# Patient Record
Sex: Female | Born: 1955
Health system: Southern US, Community
[De-identification: ages and names within clinical notes are randomized; demographics above are authoritative.]

## PROBLEM LIST (undated history)

## (undated) DIAGNOSIS — I10 Essential (primary) hypertension: Secondary | ICD-10-CM

## (undated) DIAGNOSIS — E7800 Pure hypercholesterolemia, unspecified: Secondary | ICD-10-CM

## (undated) DIAGNOSIS — F419 Anxiety disorder, unspecified: Secondary | ICD-10-CM

## (undated) HISTORY — PX: TONSILLECTOMY: SUR1361

## (undated) HISTORY — PX: OTHER SURGICAL HISTORY: SHX169

## (undated) HISTORY — DX: Essential (primary) hypertension: I10

## (undated) HISTORY — DX: Anxiety disorder, unspecified: F41.9

## (undated) HISTORY — DX: Pure hypercholesterolemia, unspecified: E78.00

## (undated) HISTORY — PX: COLONOSCOPY: SHX174

---

## 2004-10-06 ENCOUNTER — Ambulatory Visit: Payer: Self-pay

## 2004-10-27 ENCOUNTER — Emergency Department: Payer: Self-pay | Admitting: Emergency Medicine

## 2004-10-27 ENCOUNTER — Other Ambulatory Visit: Payer: Self-pay

## 2004-11-05 ENCOUNTER — Emergency Department: Payer: Self-pay | Admitting: Emergency Medicine

## 2005-10-14 ENCOUNTER — Ambulatory Visit: Payer: Self-pay

## 2006-10-28 ENCOUNTER — Ambulatory Visit: Payer: Self-pay | Admitting: Gastroenterology

## 2006-11-15 ENCOUNTER — Ambulatory Visit: Payer: Self-pay | Admitting: Internal Medicine

## 2007-11-21 ENCOUNTER — Ambulatory Visit: Payer: Self-pay | Admitting: Internal Medicine

## 2009-10-02 ENCOUNTER — Ambulatory Visit: Payer: Self-pay | Admitting: Internal Medicine

## 2011-04-13 ENCOUNTER — Ambulatory Visit: Payer: Self-pay

## 2012-10-11 ENCOUNTER — Ambulatory Visit: Payer: Self-pay

## 2013-06-21 DIAGNOSIS — C8330 Diffuse large B-cell lymphoma, unspecified site: Secondary | ICD-10-CM | POA: Insufficient documentation

## 2014-05-15 ENCOUNTER — Ambulatory Visit (INDEPENDENT_AMBULATORY_CARE_PROVIDER_SITE_OTHER): Payer: BC Managed Care – PPO | Admitting: Podiatry

## 2014-05-15 ENCOUNTER — Encounter: Payer: Self-pay | Admitting: Podiatry

## 2014-05-15 ENCOUNTER — Ambulatory Visit (INDEPENDENT_AMBULATORY_CARE_PROVIDER_SITE_OTHER): Payer: BC Managed Care – PPO

## 2014-05-15 VITALS — BP 128/76 | HR 84 | Resp 16 | Ht 65.0 in | Wt 150.0 lb

## 2014-05-15 DIAGNOSIS — M79673 Pain in unspecified foot: Secondary | ICD-10-CM

## 2014-05-15 DIAGNOSIS — M779 Enthesopathy, unspecified: Secondary | ICD-10-CM

## 2014-05-15 NOTE — Progress Notes (Signed)
   Subjective:    Patient ID: Jasmine Barron, female    DOB: [birth year], 59 y.o.   MRN: [id number]  HPI Comments: Second week in December the right foot 2nd met , painful at times, sometimes it feels like walking on a rock   Foot Pain      Review of Systems  All other systems reviewed and are negative.      Objective:   Physical Exam: I have reviewed her past medical history medications allergies surgery social history and review of systems. Ulcers are strongly palpable bilateral. Neurologic sensorium is intact per Semmes-Weinstein monofilament. Deep tendon reflexes are intact bilateral and muscle strength is 5 over 5 dorsiflexion plantar flexors and inverters and everters all intrinsic musculature is intact. Orthopedic evaluation demonstrates all joints distal to the ankle for range of motion without crepitus. There she does have pain on end range of motion of the second metatarsophalangeal joint on dorsiflexion and plantarflexion. Hallux abductovalgus deformity is prominent right over left. Radiographic evaluation does demonstrate an elongated second metatarsal with an increase in the first intermetatarsal angle to the normal value of the right foot. Medial deviation of the second digit is also noted.        Assessment & Plan:  Assessment: Capsulitis second metatarsophalangeal joint right. Capsulitis with early dislocation syndrome second metatarsophalangeal joint right. Hallux abductovalgus deformity right.  Plan: Discussed appropriate shoe gear stress changes sizes ice therapy shoe modifications. Discussed the etiology pathology conservative versus surgical therapies. After sterile Betadine skin prep I injected 2 mg of dexamethasone with local anesthetic to the second metatarsophalangeal joint intra-articularly. She was also dispensed a carbon graphite insole. I will follow-up with her in 1 month. We discussed the possible need for orthotics.

## 2014-06-12 ENCOUNTER — Encounter: Payer: Self-pay | Admitting: Podiatry

## 2014-06-12 ENCOUNTER — Ambulatory Visit (INDEPENDENT_AMBULATORY_CARE_PROVIDER_SITE_OTHER): Payer: BC Managed Care – PPO | Admitting: Podiatry

## 2014-06-12 VITALS — BP 108/67 | HR 81 | Resp 16

## 2014-06-12 DIAGNOSIS — M779 Enthesopathy, unspecified: Secondary | ICD-10-CM

## 2014-06-12 NOTE — Progress Notes (Signed)
She presents today for follow-up of capsulitis sub-second metatarsophalangeal joint right foot. She states that she's been taping the toes together and seems to be helping to some degree.  Objective: Vital signs are stable she is alert and oriented 3 she has pain on end range of motion of the second metatarsophalangeal joint with thick swelling beneath the second metatarsophalangeal joint right foot.  Assessment: Chronic capsulitis second metatarsophalangeal joint of the right foot.  Plan: She was scanned for set of orthotics today. We did discuss the possible need for surgical procedure in the late spring or early summer.

## 2014-06-26 ENCOUNTER — Emergency Department
Admit: 2014-06-26 | Disposition: A | Discharge status: Home / Self Care | Payer: Self-pay | Admitting: Emergency Medicine

## 2014-07-08 ENCOUNTER — Ambulatory Visit: Payer: BC Managed Care – PPO

## 2014-07-08 ENCOUNTER — Ambulatory Visit (INDEPENDENT_AMBULATORY_CARE_PROVIDER_SITE_OTHER): Payer: BC Managed Care – PPO | Admitting: *Deleted

## 2014-07-08 DIAGNOSIS — M779 Enthesopathy, unspecified: Secondary | ICD-10-CM

## 2014-07-08 NOTE — Patient Instructions (Signed)

## 2014-07-08 NOTE — Progress Notes (Signed)
Orthotics dispensed. Given instructions for breakin. Will see in 1 mo. For eval., if needed.

## 2014-07-15 ENCOUNTER — Ambulatory Visit: Payer: BC Managed Care – PPO

## 2014-08-07 ENCOUNTER — Ambulatory Visit: Payer: BC Managed Care – PPO | Admitting: Podiatry

## 2017-03-15 ENCOUNTER — Other Ambulatory Visit: Payer: Self-pay | Admitting: Nurse Practitioner

## 2017-03-16 ENCOUNTER — Other Ambulatory Visit: Payer: Self-pay

## 2017-03-16 MED ORDER — LUBIPROSTONE 24 MCG PO CAPS: 24.0000 ug | ORAL_CAPSULE | Freq: Two times a day (BID) | ORAL | 1 refills | Status: DC

## 2017-04-15 ENCOUNTER — Other Ambulatory Visit: Payer: Self-pay

## 2017-04-15 MED ORDER — CITALOPRAM HYDROBROMIDE 40 MG PO TABS: 40.0000 mg | ORAL_TABLET | Freq: Every day | ORAL | 3 refills | Status: DC

## 2017-06-07 ENCOUNTER — Ambulatory Visit: Payer: BC Managed Care – PPO | Admitting: Nurse Practitioner

## 2017-06-07 ENCOUNTER — Encounter: Payer: Self-pay | Admitting: Nurse Practitioner

## 2017-06-07 VITALS — BP 120/80 | HR 75 | Resp 16 | Ht 65.0 in | Wt 140.0 lb

## 2017-06-07 DIAGNOSIS — I10 Essential (primary) hypertension: Secondary | ICD-10-CM

## 2017-06-07 DIAGNOSIS — E7800 Pure hypercholesterolemia, unspecified: Secondary | ICD-10-CM | POA: Diagnosis not present

## 2017-06-07 DIAGNOSIS — F419 Anxiety disorder, unspecified: Secondary | ICD-10-CM

## 2017-06-07 DIAGNOSIS — F321 Major depressive disorder, single episode, moderate: Secondary | ICD-10-CM | POA: Diagnosis not present

## 2017-06-07 NOTE — Progress Notes (Signed)
[facility] [place], [place] [address]  Internal MEDICINE  Office Visit Note  Patient Name: Jasmine Barron  [id number]  [id number]  Date of Service: 06/08/2017  Chief Complaint  Patient presents with  . Hypertension    The patient is here for routine follow up exam. She admits to increased depression and anxiety. Has several personal issues all happening at once. Feels overwhelmed at times. States that she is still having more good days than bad, overall.  She has no other physical concerns or complaints at this time.     Pt is here for routine follow up.    Current Medication: Outpatient Encounter Medications as of 06/07/2017  Medication Sig  . citalopram (CELEXA) 40 MG tablet Take 1 tablet (40 mg total) by mouth daily.  . clonazePAM (KLONOPIN) 0.5 MG tablet Take 0.5 mg by mouth 2 (two) times daily as needed for anxiety.  Marland Kitchen lisinopril (PRINIVIL,ZESTRIL) 5 MG tablet Take 5 mg by mouth daily.  Marland Kitchen lubiprostone (AMITIZA) 24 MCG capsule Take 1 capsule (24 mcg total) by mouth 2 (two) times daily with a meal.  . simvastatin (ZOCOR) 20 MG tablet Take 20 mg by mouth daily.  . [DISCONTINUED] predniSONE (DELTASONE) 10 MG tablet Take 10 mg by mouth daily with breakfast.   No facility-administered encounter medications on file as of 06/07/2017.     Surgical History: History reviewed. No pertinent surgical history.  Medical History: Past Medical History:  Diagnosis Date  . Anxiety   . High cholesterol   . Hypertension     Family History: No family history on file.  Social History   Socioeconomic History  . Marital status: Divorced    Spouse name: Not on file  . Number of children: Not on file  . Years of education: Not on file  . Highest education level: Not on file  Social Needs  . Financial resource strain: Not on file  . Food insecurity - worry: Not on file  . Food insecurity - inability: Not on file  . Transportation needs - medical: Not on  file  . Transportation needs - non-medical: Not on file  Occupational History  . Not on file  Tobacco Use  . Smoking status: Never Smoker  . Smokeless tobacco: Never Used  Substance and Sexual Activity  . Alcohol use: Yes    Alcohol/week: 0.0 oz    Frequency: Never    Comment: social  . Drug use: No  . Sexual activity: Not on file  Other Topics Concern  . Not on file  Social History Narrative  . Not on file      Review of Systems  Constitutional: Negative for activity change, chills, fatigue and unexpected weight change.  HENT: Negative for congestion, postnasal drip, rhinorrhea, sneezing and sore throat.   Eyes: Negative.  Negative for redness.  Respiratory: Negative for cough, chest tightness, shortness of breath and wheezing.   Cardiovascular: Negative for chest pain and palpitations.  Gastrointestinal: Negative for abdominal pain, constipation, diarrhea and nausea.  Endocrine: Negative for cold intolerance, heat intolerance, polydipsia, polyphagia and polyuria.  Genitourinary: Negative for dysuria and frequency.  Musculoskeletal: Negative for arthralgias, back pain, joint swelling and neck pain.  Skin: Negative for rash.  Allergic/Immunologic: Negative for environmental allergies.  Neurological: Negative for tremors, numbness and headaches.  Hematological: Negative for adenopathy. Does not bruise/bleed easily.  Psychiatric/Behavioral: Positive for behavioral problems (Depression). Negative for sleep disturbance and suicidal ideas. The patient is nervous/anxious.  Vital Signs: BP 120/80   Pulse 75   Resp 16   Ht 5\' 5"  (1.651 m)   Wt 140 lb (63.5 kg)   SpO2 97%   BMI 23.30 kg/m    Physical Exam  Constitutional: She is oriented to person, place, and time. She appears well-developed and well-nourished. No distress.  HENT:  Head: Normocephalic and atraumatic.  Mouth/Throat: Oropharynx is clear and moist. No oropharyngeal exudate.  Eyes: EOM are normal. Pupils  are equal, round, and reactive to light.  Neck: Normal range of motion. Neck supple. No JVD present. Carotid bruit is not present. No tracheal deviation present. No thyromegaly present.  Cardiovascular: Normal rate, regular rhythm and normal heart sounds. Exam reveals no gallop and no friction rub.  No murmur heard. Pulmonary/Chest: Effort normal and breath sounds normal. No respiratory distress. She has no wheezes. She has no rales. She exhibits no tenderness.  Abdominal: Soft. Bowel sounds are normal. There is no tenderness.  Musculoskeletal: Normal range of motion.  Lymphadenopathy:    She has no cervical adenopathy.  Neurological: She is alert and oriented to person, place, and time. No cranial nerve deficit.  Skin: Skin is warm and dry. She is not diaphoretic.  Psychiatric: She has a normal mood and affect. Her behavior is normal. Judgment and thought content normal.  Nursing note and vitals reviewed.  Assessment/Plan: 1. Essential hypertension Very stable. Continue bp medicaton as prescribed.   2. High cholesterol Recent cholesterol panel stable. Continue statin as prescribed.   3. Moderate major depression (HCC) Mildly worse due to increased personal stress. Will keep citalopram at 40mg  daily for now. Will adjust as indicated.   4. Anxiety May continue clonazepam 0.5mg  twice daily if needed for acute anxiety.   General Counseling: bradleigh sonnen understanding of the findings of todays visit and agrees with plan of treatment. I have discussed any further diagnostic evaluation that may be needed or ordered today. We also reviewed her medications today. she has been encouraged to call the office with any questions or concerns that should arise related to todays visit.   This patient was seen by Leretha Pol, FNP- C in Collaboration with Dr Lavera Guise as a part of collaborative care agreement   Time spent: 107 Minutes     Dr Lavera Guise Internal medicine

## 2017-06-08 ENCOUNTER — Encounter: Payer: Self-pay | Admitting: Nurse Practitioner

## 2017-06-08 DIAGNOSIS — E7800 Pure hypercholesterolemia, unspecified: Secondary | ICD-10-CM | POA: Insufficient documentation

## 2017-06-08 DIAGNOSIS — F321 Major depressive disorder, single episode, moderate: Secondary | ICD-10-CM | POA: Insufficient documentation

## 2017-06-08 DIAGNOSIS — I10 Essential (primary) hypertension: Secondary | ICD-10-CM | POA: Insufficient documentation

## 2017-06-08 DIAGNOSIS — F419 Anxiety disorder, unspecified: Secondary | ICD-10-CM | POA: Insufficient documentation

## 2017-08-04 ENCOUNTER — Telehealth: Payer: Self-pay | Admitting: Internal Medicine

## 2017-08-04 NOTE — Telephone Encounter (Signed)
Tried calling pt to advise her cologuard results are negative, pt number has been disconnected unable to reach pt. Jasmine Barron

## 2017-08-15 ENCOUNTER — Other Ambulatory Visit: Payer: Self-pay

## 2017-08-15 MED ORDER — CITALOPRAM HYDROBROMIDE 40 MG PO TABS: 40.0000 mg | ORAL_TABLET | Freq: Every day | ORAL | 5 refills | Status: DC

## 2017-09-28 NOTE — Progress Notes (Signed)
Error

## 2017-10-05 ENCOUNTER — Other Ambulatory Visit: Payer: Self-pay | Admitting: Internal Medicine

## 2017-12-15 ENCOUNTER — Ambulatory Visit: Payer: BC Managed Care – PPO | Admitting: Nurse Practitioner

## 2017-12-15 ENCOUNTER — Encounter: Payer: Self-pay | Admitting: Nurse Practitioner

## 2017-12-15 VITALS — BP 122/80 | HR 77 | Resp 16 | Ht 65.0 in | Wt 141.8 lb

## 2017-12-15 DIAGNOSIS — R300 Dysuria: Secondary | ICD-10-CM | POA: Insufficient documentation

## 2017-12-15 DIAGNOSIS — Z1239 Encounter for other screening for malignant neoplasm of breast: Secondary | ICD-10-CM | POA: Insufficient documentation

## 2017-12-15 DIAGNOSIS — K5909 Other constipation: Secondary | ICD-10-CM | POA: Diagnosis not present

## 2017-12-15 DIAGNOSIS — E559 Vitamin D deficiency, unspecified: Secondary | ICD-10-CM

## 2017-12-15 DIAGNOSIS — Z0001 Encounter for general adult medical examination with abnormal findings: Secondary | ICD-10-CM

## 2017-12-15 DIAGNOSIS — Z1231 Encounter for screening mammogram for malignant neoplasm of breast: Secondary | ICD-10-CM

## 2017-12-15 DIAGNOSIS — E782 Mixed hyperlipidemia: Secondary | ICD-10-CM | POA: Diagnosis not present

## 2017-12-15 DIAGNOSIS — I10 Essential (primary) hypertension: Secondary | ICD-10-CM | POA: Diagnosis not present

## 2017-12-15 DIAGNOSIS — F419 Anxiety disorder, unspecified: Secondary | ICD-10-CM

## 2017-12-15 DIAGNOSIS — F321 Major depressive disorder, single episode, moderate: Secondary | ICD-10-CM

## 2017-12-15 MED ORDER — LUBIPROSTONE 24 MCG PO CAPS: 24.0000 ug | ORAL_CAPSULE | Freq: Two times a day (BID) | ORAL | 1 refills | Status: DC

## 2017-12-15 MED ORDER — LISINOPRIL 5 MG PO TABS: 5.0000 mg | ORAL_TABLET | Freq: Every day | ORAL | 5 refills | Status: DC

## 2017-12-15 MED ORDER — SIMVASTATIN 20 MG PO TABS: 20.0000 mg | ORAL_TABLET | Freq: Every day | ORAL | 5 refills | Status: DC

## 2017-12-15 MED ORDER — CLONAZEPAM 0.5 MG PO TABS: 0.5000 mg | ORAL_TABLET | Freq: Two times a day (BID) | ORAL | 3 refills | Status: DC | PRN

## 2017-12-15 MED ORDER — CITALOPRAM HYDROBROMIDE 40 MG PO TABS: 40.0000 mg | ORAL_TABLET | Freq: Every day | ORAL | 5 refills | Status: DC

## 2017-12-15 NOTE — Progress Notes (Signed)
[facility] [place], [place] [address]  Internal MEDICINE  Office Visit Note  Patient Name: Jasmine Barron  [id number]  [id number]  Date of Service: 12/15/2017   Pt is here for routine health maintenance examination  Chief Complaint  Patient presents with  . Annual Exam    6 month physical  . Hypertension     Hypertension  This is a chronic problem. The current episode started more than 1 year ago. The problem is unchanged. The problem is controlled. Associated symptoms include anxiety. Pertinent negatives include no chest pain, headaches, neck pain, palpitations or shortness of breath. There are no associated agents to hypertension. Risk factors for coronary artery disease include dyslipidemia, post-menopausal state and stress. Past treatments include ACE inhibitors. The current treatment provides moderate improvement. There are no compliance problems.      Current Medication: Outpatient Encounter Medications as of 12/15/2017  Medication Sig  . citalopram (CELEXA) 40 MG tablet Take 1 tablet (40 mg total) by mouth daily.  . clonazePAM (KLONOPIN) 0.5 MG tablet Take 1 tablet (0.5 mg total) by mouth 2 (two) times daily as needed for anxiety.  Marland Kitchen lisinopril (PRINIVIL,ZESTRIL) 5 MG tablet Take 1 tablet (5 mg total) by mouth daily.  Marland Kitchen lubiprostone (AMITIZA) 24 MCG capsule Take 1 capsule (24 mcg total) by mouth 2 (two) times daily with a meal.  . simvastatin (ZOCOR) 20 MG tablet Take 1 tablet (20 mg total) by mouth at bedtime.  . [DISCONTINUED] citalopram (CELEXA) 40 MG tablet Take 1 tablet (40 mg total) by mouth daily.  . [DISCONTINUED] clonazePAM (KLONOPIN) 0.5 MG tablet Take 0.5 mg by mouth 2 (two) times daily as needed for anxiety.  . [DISCONTINUED] lisinopril (PRINIVIL,ZESTRIL) 5 MG tablet Take 5 mg by mouth daily.  . [DISCONTINUED] lubiprostone (AMITIZA) 24 MCG capsule Take 1 capsule (24 mcg total) by mouth 2 (two) times daily with a meal.  . [DISCONTINUED]  simvastatin (ZOCOR) 20 MG tablet TAKE 1 TABLET BY MOUTH AT BEDTIME   No facility-administered encounter medications on file as of 12/15/2017.     Surgical History: History reviewed. No pertinent surgical history.  Medical History: Past Medical History:  Diagnosis Date  . Anxiety   . High cholesterol   . Hypertension     Family History: Family History  Problem Relation Age of Onset  . Diabetes Mother   . Heart disease Mother   . Hypertension Mother   . Stroke Father   . Diabetes Brother   . Diabetes Maternal Grandmother       Review of Systems  Constitutional: Negative for activity change, chills, fatigue and unexpected weight change.  HENT: Negative for congestion, postnasal drip, rhinorrhea, sneezing and sore throat.   Eyes: Negative.  Negative for redness.  Respiratory: Negative for cough, chest tightness, shortness of breath and wheezing.   Cardiovascular: Negative for chest pain and palpitations.  Gastrointestinal: Positive for constipation. Negative for abdominal pain, diarrhea and nausea.       Chronic   Endocrine: Negative for cold intolerance, heat intolerance, polydipsia, polyphagia and polyuria.  Genitourinary: Negative.  Negative for dysuria and frequency.  Musculoskeletal: Negative for arthralgias, back pain, joint swelling and neck pain.  Skin: Negative for rash.  Allergic/Immunologic: Negative for environmental allergies.  Neurological: Negative for dizziness, tremors, numbness and headaches.  Hematological: Negative for adenopathy. Does not bruise/bleed easily.  Psychiatric/Behavioral: Positive for behavioral problems (Depression). Negative for sleep disturbance and suicidal ideas. The patient is nervous/anxious.  Today's Vitals   12/15/17 1416  BP: 122/80  Pulse: 77  Resp: 16  SpO2: 98%  Weight: 141 lb 12.8 oz (64.3 kg)  Height: 5\' 5"  (1.651 m)    Physical Exam  Constitutional: She is oriented to person, place, and time. She appears  well-developed and well-nourished. No distress.  HENT:  Head: Normocephalic and atraumatic.  Nose: Nose normal.  Mouth/Throat: Oropharynx is clear and moist. No oropharyngeal exudate.  Eyes: Pupils are equal, round, and reactive to light. Conjunctivae and EOM are normal.  Neck: Normal range of motion. Neck supple. No JVD present. Carotid bruit is not present. No tracheal deviation present. No thyromegaly present.  Cardiovascular: Normal rate, regular rhythm, normal heart sounds and intact distal pulses. Exam reveals no gallop and no friction rub.  No murmur heard. Pulmonary/Chest: Effort normal and breath sounds normal. No respiratory distress. She has no wheezes. She has no rales. She exhibits no tenderness.  Abdominal: Soft. Bowel sounds are normal. There is no tenderness.  Musculoskeletal: Normal range of motion.  Lymphadenopathy:    She has no cervical adenopathy.  Neurological: She is alert and oriented to person, place, and time. No cranial nerve deficit.  Skin: Skin is warm and dry. Capillary refill takes less than 2 seconds. She is not diaphoretic.  Psychiatric: She has a normal mood and affect. Her behavior is normal. Judgment and thought content normal.  Nursing note and vitals reviewed.  Assessment/Plan: 1. Encounter for general adult medical examination with abnormal findings Annual health maintenance exam today - CBC with Differential/Platelet - Comprehensive metabolic panel - T4, free - TSH - Lipid panel  2. Essential hypertension Stable. Continue bp medication as prescribed.  - CBC with Differential/Platelet - Comprehensive metabolic panel - lisinopril (PRINIVIL,ZESTRIL) 5 MG tablet; Take 1 tablet (5 mg total) by mouth daily.  Dispense: 30 tablet; Refill: 5  3. Mixed hyperlipidemia Check fasting lipid panel and adjust simvastatin as indicated.  - simvastatin (ZOCOR) 20 MG tablet; Take 1 tablet (20 mg total) by mouth at bedtime.  Dispense: 30 tablet; Refill: 5  4.  Chronic constipation - lubiprostone (AMITIZA) 24 MCG capsule; Take 1 capsule (24 mcg total) by mouth 2 (two) times daily with a meal.  Dispense: 60 capsule; Refill: 1  5. Moderate major depression (HCC) - citalopram (CELEXA) 40 MG tablet; Take 1 tablet (40 mg total) by mouth daily.  Dispense: 30 tablet; Refill: 5  6. Anxiety May continue clonazepam 0.5mg  twice daily if needed for acute anxiety. New prescription provided today  - clonazePAM (KLONOPIN) 0.5 MG tablet; Take 1 tablet (0.5 mg total) by mouth 2 (two) times daily as needed for anxiety.  Dispense: 60 tablet; Refill: 3  7. Vitamin D deficiency - Vitamin D 1,25 dihydroxy  8. Screening for breast cancer - MM DIGITAL SCREENING BILATERAL; Future  9. Dysuria - UA/M w/rflx Culture, Routine  General Counseling: Jasmine Barron verbalizes understanding of the findings of todays visit and agrees with plan of treatment. I have discussed any further diagnostic evaluation that may be needed or ordered today. We also reviewed her medications today. she has been encouraged to call the office with any questions or concerns that should arise related to todays visit.    Counseling:  Hypertension Counseling:   The following hypertensive lifestyle modification were recommended and discussed:  1. Limiting alcohol intake to less than 1 oz/day of ethanol:(24 oz of beer or 8 oz of wine or 2 oz of 100-proof whiskey). 2. Take baby ASA 81 mg daily.  3. Importance of regular aerobic exercise and losing weight. 4. Reduce dietary saturated fat and cholesterol intake for overall cardiovascular health. 5. Maintaining adequate dietary potassium, calcium, and magnesium intake. 6. Regular monitoring of the blood pressure. 7. Reduce sodium intake to less than 100 mmol/day (less than 2.3 gm of sodium or less than 6 gm of sodium choride)   This patient was seen by [place] with Dr Lavera Guise as a part of collaborative care agreement  Orders Placed  This Encounter  Procedures  . MM DIGITAL SCREENING BILATERAL  . UA/M w/rflx Culture, Routine  . CBC with Differential/Platelet  . Comprehensive metabolic panel  . T4, free  . TSH  . Lipid panel  . Vitamin D 1,25 dihydroxy    Meds ordered this encounter  Medications  . citalopram (CELEXA) 40 MG tablet    Sig: Take 1 tablet (40 mg total) by mouth daily.    Dispense:  30 tablet    Refill:  5    Order Specific Question:   Supervising Provider    Answer:   Lavera Guise [[id number]]  . clonazePAM (KLONOPIN) 0.5 MG tablet    Sig: Take 1 tablet (0.5 mg total) by mouth 2 (two) times daily as needed for anxiety.    Dispense:  60 tablet    Refill:  3    Order Specific Question:   Supervising Provider    Answer:   Lavera Guise [[id number]]  . lisinopril (PRINIVIL,ZESTRIL) 5 MG tablet    Sig: Take 1 tablet (5 mg total) by mouth daily.    Dispense:  30 tablet    Refill:  5    Order Specific Question:   Supervising Provider    Answer:   Lavera Guise [[id number]]  . lubiprostone (AMITIZA) 24 MCG capsule    Sig: Take 1 capsule (24 mcg total) by mouth 2 (two) times daily with a meal.    Dispense:  60 capsule    Refill:  1    Order Specific Question:   Supervising Provider    Answer:   Lavera Guise [place]  . simvastatin (ZOCOR) 20 MG tablet    Sig: Take 1 tablet (20 mg total) by mouth at bedtime.    Dispense:  30 tablet    Refill:  5    Please consider 90 day supplies to promote better adherence    Order Specific Question:   Supervising Provider    Answer:   Lavera Guise [[id number]]    Time spent: [place], MD  Internal Medicine

## 2017-12-16 LAB — UA/M W/RFLX CULTURE, ROUTINE
Bilirubin, UA: NEGATIVE
Glucose, UA: NEGATIVE
KETONES UA: NEGATIVE
Leukocytes, UA: NEGATIVE
Nitrite, UA: NEGATIVE
PH UA: 6.5 (ref 5.0–7.5)
Protein, UA: NEGATIVE
RBC, UA: NEGATIVE
Specific Gravity, UA: 1.013 (ref 1.005–1.030)
UUROB: 0.2 mg/dL (ref 0.2–1.0)

## 2017-12-16 LAB — MICROSCOPIC EXAMINATION
Bacteria, UA: NONE SEEN
CASTS: NONE SEEN /LPF
EPITHELIAL CELLS (NON RENAL): NONE SEEN /HPF (ref 0–10)
RBC MICROSCOPIC, UA: NONE SEEN /HPF (ref 0–2)

## 2018-01-03 ENCOUNTER — Encounter: Payer: Self-pay | Admitting: Internal Medicine

## 2018-01-03 LAB — COLOGUARD

## 2018-01-03 NOTE — Progress Notes (Signed)
SCANNED IN COLOGUARD RESULTS REPORTED ON 08/02/17.

## 2018-06-15 ENCOUNTER — Ambulatory Visit: Payer: BC Managed Care – PPO | Admitting: Nurse Practitioner

## 2018-06-15 ENCOUNTER — Other Ambulatory Visit: Payer: Self-pay | Admitting: Nurse Practitioner

## 2018-06-15 ENCOUNTER — Other Ambulatory Visit: Payer: Self-pay

## 2018-06-15 ENCOUNTER — Encounter: Payer: Self-pay | Admitting: Nurse Practitioner

## 2018-06-15 VITALS — BP 139/72 | HR 64 | Resp 16 | Ht 65.0 in | Wt 137.6 lb

## 2018-06-15 DIAGNOSIS — F419 Anxiety disorder, unspecified: Secondary | ICD-10-CM

## 2018-06-15 DIAGNOSIS — I10 Essential (primary) hypertension: Secondary | ICD-10-CM | POA: Diagnosis not present

## 2018-06-15 DIAGNOSIS — K5909 Other constipation: Secondary | ICD-10-CM

## 2018-06-15 MED ORDER — CLONAZEPAM 0.5 MG PO TABS: 0.5000 mg | ORAL_TABLET | Freq: Two times a day (BID) | ORAL | 3 refills | Status: DC | PRN

## 2018-06-15 MED ORDER — LINACLOTIDE 145 MCG PO CAPS: 145.0000 ug | ORAL_CAPSULE | Freq: Every day | ORAL | 5 refills | Status: DC

## 2018-06-15 NOTE — Progress Notes (Signed)
[facility] [place], [place] [address]  Internal MEDICINE  Office Visit Note  Patient Name: Jasmine Barron  [id number]  [id number]  Date of Service: 06/29/2018  Chief Complaint  Patient presents with  . Medical Management of Chronic Issues    6 month follow up  . Hypertension  . Hyperlipidemia    The patient is here for routine follow up exam. She admits to increased depression and anxiety. Has several personal issues all happening at once. Feels overwhelmed at times. States that she is still having more good days than bad, overall.  She has no other physical concerns or complaints at this time.  Hypertension  This is a chronic problem. The current episode started more than 1 year ago. The problem is controlled. Associated symptoms include anxiety and malaise/fatigue. Pertinent negatives include no chest pain, headaches, neck pain, palpitations or shortness of breath. Risk factors for coronary artery disease include dyslipidemia and stress. Past treatments include ACE inhibitors. The current treatment provides moderate improvement. There are no compliance problems.        Current Medication: Outpatient Encounter Medications as of 06/15/2018  Medication Sig  . citalopram (CELEXA) 40 MG tablet Take 1 tablet (40 mg total) by mouth daily.  . clonazePAM (KLONOPIN) 0.5 MG tablet Take 1 tablet (0.5 mg total) by mouth 2 (two) times daily as needed for anxiety.  Marland Kitchen lisinopril (PRINIVIL,ZESTRIL) 5 MG tablet Take 1 tablet (5 mg total) by mouth daily.  . simvastatin (ZOCOR) 20 MG tablet Take 1 tablet (20 mg total) by mouth at bedtime.  . [DISCONTINUED] clonazePAM (KLONOPIN) 0.5 MG tablet Take 1 tablet (0.5 mg total) by mouth 2 (two) times daily as needed for anxiety.  . [DISCONTINUED] lubiprostone (AMITIZA) 24 MCG capsule Take 1 capsule (24 mcg total) by mouth 2 (two) times daily with a meal.  . linaclotide (LINZESS) 145 MCG CAPS capsule Take 1 capsule (145 mcg total) by  mouth daily.   No facility-administered encounter medications on file as of 06/15/2018.     Surgical History: History reviewed. No pertinent surgical history.  Medical History: Past Medical History:  Diagnosis Date  . Anxiety   . High cholesterol   . Hypertension     Family History: Family History  Problem Relation Age of Onset  . Diabetes Mother   . Heart disease Mother   . Hypertension Mother   . Stroke Father   . Diabetes Brother   . Diabetes Maternal Grandmother     Social History   Socioeconomic History  . Marital status: Divorced    Spouse name: Not on file  . Number of children: Not on file  . Years of education: Not on file  . Highest education level: Not on file  Occupational History  . Not on file  Social Needs  . Financial resource strain: Not on file  . Food insecurity:    Worry: Not on file    Inability: Not on file  . Transportation needs:    Medical: Not on file    Non-medical: Not on file  Tobacco Use  . Smoking status: Never Smoker  . Smokeless tobacco: Never Used  Substance and Sexual Activity  . Alcohol use: Yes    Alcohol/week: 0.0 standard drinks    Frequency: Never    Comment: social  . Drug use: No  . Sexual activity: Not on file  Lifestyle  . Physical activity:    Days per week: Not on file    Minutes  per session: Not on file  . Stress: Not on file  Relationships  . Social connections:    Talks on phone: Not on file    Gets together: Not on file    Attends religious service: Not on file    Active member of club or organization: Not on file    Attends meetings of clubs or organizations: Not on file    Relationship status: Not on file  . Intimate partner violence:    Fear of current or ex partner: Not on file    Emotionally abused: Not on file    Physically abused: Not on file    Forced sexual activity: Not on file  Other Topics Concern  . Not on file  Social History Narrative  . Not on file      Review of Systems   Constitutional: Positive for malaise/fatigue. Negative for activity change, chills, fatigue and unexpected weight change.  HENT: Negative for congestion, postnasal drip, rhinorrhea, sneezing and sore throat.   Eyes: Negative.  Negative for redness.  Respiratory: Negative for cough, chest tightness, shortness of breath and wheezing.   Cardiovascular: Negative for chest pain and palpitations.  Gastrointestinal: Positive for constipation. Negative for abdominal pain, diarrhea and nausea.       Chronic   Endocrine: Negative for cold intolerance, heat intolerance, polydipsia and polyuria.  Musculoskeletal: Negative for arthralgias, back pain, joint swelling and neck pain.  Skin: Negative for rash.  Allergic/Immunologic: Negative for environmental allergies.  Neurological: Negative for dizziness, tremors, numbness and headaches.  Hematological: Negative for adenopathy. Does not bruise/bleed easily.  Psychiatric/Behavioral: Positive for dysphoric mood. Negative for behavioral problems (Depression), sleep disturbance and suicidal ideas. The patient is nervous/anxious.     Today's Vitals   06/15/18 1535  BP: 139/72  Pulse: 64  Resp: 16  SpO2: 99%  Weight: 137 lb 9.6 oz (62.4 kg)  Height: 5\' 5"  (1.651 m)   Body mass index is 22.9 kg/m.  Physical Exam Vitals signs and nursing note reviewed.  Constitutional:      General: She is not in acute distress.    Appearance: Normal appearance. She is well-developed. She is not diaphoretic.  HENT:     Head: Normocephalic and atraumatic.     Nose: Nose normal.     Mouth/Throat:     Pharynx: No oropharyngeal exudate.  Eyes:     Conjunctiva/sclera: Conjunctivae normal.     Pupils: Pupils are equal, round, and reactive to light.  Neck:     Musculoskeletal: Normal range of motion and neck supple.     Thyroid: No thyromegaly.     Vascular: No carotid bruit or JVD.     Trachea: No tracheal deviation.  Cardiovascular:     Rate and Rhythm: Normal  rate and regular rhythm.     Heart sounds: Normal heart sounds. No murmur. No friction rub. No gallop.   Pulmonary:     Effort: Pulmonary effort is normal. No respiratory distress.     Breath sounds: Normal breath sounds. No wheezing or rales.  Chest:     Chest wall: No tenderness.  Abdominal:     General: Bowel sounds are normal.     Palpations: Abdomen is soft.     Tenderness: There is no abdominal tenderness.  Musculoskeletal: Normal range of motion.  Lymphadenopathy:     Cervical: No cervical adenopathy.  Skin:    General: Skin is warm and dry.     Capillary Refill: Capillary refill takes less than 2  seconds.  Neurological:     Mental Status: She is alert and oriented to person, place, and time.     Cranial Nerves: No cranial nerve deficit.  Psychiatric:        Behavior: Behavior normal.        Thought Content: Thought content normal.        Judgment: Judgment normal.    Assessment/Plan: 1. Essential hypertension Stable. Continue bp medication as prescribed   2. Chronic constipation amitiza not really working for constipation. Change to linzess 156mcg daily. Increased fiber in the diet and drink plenty of water.  - linaclotide (LINZESS) 145 MCG CAPS capsule; Take 1 capsule (145 mcg total) by mouth daily.  Dispense: 30 capsule; Refill: 5  3. Anxiety May continue to take clonazepam 0.5mg  twice daily as needed for acute anxiety. Refills providedtodya.  - clonazePAM (KLONOPIN) 0.5 MG tablet; Take 1 tablet (0.5 mg total) by mouth 2 (two) times daily as needed for anxiety.  Dispense: 60 tablet; Refill: 3  General Counseling: Courtland verbalizes understanding of the findings of todays visit and agrees with plan of treatment. I have discussed any further diagnostic evaluation that may be needed or ordered today. We also reviewed her medications today. she has been encouraged to call the office with any questions or concerns that should arise related to todays visit.  Hypertension  Counseling:   The following hypertensive lifestyle modification were recommended and discussed:  1. Limiting alcohol intake to less than 1 oz/day of ethanol:(24 oz of beer or 8 oz of wine or 2 oz of 100-proof whiskey). 2. Take baby ASA 81 mg daily. 3. Importance of regular aerobic exercise and losing weight. 4. Reduce dietary saturated fat and cholesterol intake for overall cardiovascular health. 5. Maintaining adequate dietary potassium, calcium, and magnesium intake. 6. Regular monitoring of the blood pressure. 7. Reduce sodium intake to less than 100 mmol/day (less than 2.3 gm of sodium or less than 6 gm of sodium choride)   This patient was seen by [place] with Dr Lavera Guise as a part of collaborative care agreement  Meds ordered this encounter  Medications  . linaclotide (LINZESS) 145 MCG CAPS capsule    Sig: Take 1 capsule (145 mcg total) by mouth daily.    Dispense:  30 capsule    Refill:  5    Has been taking amitiza and this is no longer working for her.    Order Specific Question:   Supervising Provider    Answer:   Lavera Guise [[id number]]  . clonazePAM (KLONOPIN) 0.5 MG tablet    Sig: Take 1 tablet (0.5 mg total) by mouth 2 (two) times daily as needed for anxiety.    Dispense:  60 tablet    Refill:  3    Order Specific Question:   Supervising Provider    Answer:   Lavera Guise [[id number]]    Time spent: 21 Minutes      Dr Lavera Guise Internal medicine

## 2018-06-16 LAB — CBC
HEMATOCRIT: 42.2 % (ref 34.0–46.6)
HEMOGLOBIN: 14.3 g/dL (ref 11.1–15.9)
MCH: 30.3 pg (ref 26.6–33.0)
MCHC: 33.9 g/dL (ref 31.5–35.7)
MCV: 89 fL (ref 79–97)
Platelets: 270 10*3/uL (ref 150–450)
RBC: 4.72 x10E6/uL (ref 3.77–5.28)
RDW: 12.7 % (ref 11.7–15.4)
WBC: 3.7 10*3/uL (ref 3.4–10.8)

## 2018-06-16 LAB — LIPID PANEL W/O CHOL/HDL RATIO
CHOLESTEROL TOTAL: 203 mg/dL — AB (ref 100–199)
HDL: 62 mg/dL (ref >39)
LDL Calculated: 126 mg/dL — ABNORMAL HIGH (ref 0–99)
Triglycerides: 73 mg/dL (ref 0–149)
VLDL Cholesterol Cal: 15 mg/dL (ref 5–40)

## 2018-06-16 LAB — COMPREHENSIVE METABOLIC PANEL
A/G RATIO: 2.2 (ref 1.2–2.2)
ALK PHOS: 68 IU/L (ref 39–117)
ALT: 21 IU/L (ref 0–32)
AST: 22 IU/L (ref 0–40)
Albumin: 5.1 g/dL — ABNORMAL HIGH (ref 3.8–4.8)
BILIRUBIN TOTAL: 0.3 mg/dL (ref 0.0–1.2)
BUN / CREAT RATIO: 48 — AB (ref 12–28)
BUN: 22 mg/dL (ref 8–27)
CHLORIDE: 103 mmol/L (ref 96–106)
CO2: 22 mmol/L (ref 20–29)
Calcium: 9.9 mg/dL (ref 8.7–10.3)
Creatinine, Ser: 0.46 mg/dL — ABNORMAL LOW (ref 0.57–1.00)
GFR calc non Af Amer: 105 mL/min/{1.73_m2} (ref >59)
GFR, EST AFRICAN AMERICAN: 122 mL/min/{1.73_m2} (ref >59)
GLUCOSE: 88 mg/dL (ref 65–99)
Globulin, Total: 2.3 g/dL (ref 1.5–4.5)
POTASSIUM: 4.4 mmol/L (ref 3.5–5.2)
Sodium: 140 mmol/L (ref 134–144)
Total Protein: 7.4 g/dL (ref 6.0–8.5)

## 2018-06-16 LAB — TSH: TSH: 4.39 u[IU]/mL (ref 0.450–4.500)

## 2018-06-16 LAB — T4, FREE: Free T4: 1.15 ng/dL (ref 0.82–1.77)

## 2018-06-16 LAB — T3: T3, Total: 110 ng/dL (ref 71–180)

## 2018-06-16 LAB — VITAMIN D 25 HYDROXY (VIT D DEFICIENCY, FRACTURES): Vit D, 25-Hydroxy: 38.5 ng/mL (ref 30.0–100.0)

## 2018-09-18 ENCOUNTER — Other Ambulatory Visit: Payer: Self-pay

## 2018-09-18 DIAGNOSIS — I10 Essential (primary) hypertension: Secondary | ICD-10-CM

## 2018-09-18 DIAGNOSIS — F321 Major depressive disorder, single episode, moderate: Secondary | ICD-10-CM

## 2018-09-18 MED ORDER — LISINOPRIL 5 MG PO TABS: 5.0000 mg | ORAL_TABLET | Freq: Every day | ORAL | 5 refills | Status: DC

## 2018-09-18 MED ORDER — CITALOPRAM HYDROBROMIDE 40 MG PO TABS: 40.0000 mg | ORAL_TABLET | Freq: Every day | ORAL | 5 refills | Status: DC

## 2018-12-19 ENCOUNTER — Ambulatory Visit (INDEPENDENT_AMBULATORY_CARE_PROVIDER_SITE_OTHER): Payer: BC Managed Care – PPO | Admitting: Nurse Practitioner

## 2018-12-19 ENCOUNTER — Encounter: Payer: Self-pay | Admitting: Nurse Practitioner

## 2018-12-19 ENCOUNTER — Other Ambulatory Visit: Payer: Self-pay

## 2018-12-19 VITALS — BP 119/60 | HR 83 | Temp 97.3°F | Resp 16 | Ht 65.0 in | Wt 147.0 lb

## 2018-12-19 DIAGNOSIS — F419 Anxiety disorder, unspecified: Secondary | ICD-10-CM

## 2018-12-19 DIAGNOSIS — Z124 Encounter for screening for malignant neoplasm of cervix: Secondary | ICD-10-CM | POA: Diagnosis not present

## 2018-12-19 DIAGNOSIS — K5909 Other constipation: Secondary | ICD-10-CM | POA: Diagnosis not present

## 2018-12-19 DIAGNOSIS — R300 Dysuria: Secondary | ICD-10-CM

## 2018-12-19 DIAGNOSIS — I10 Essential (primary) hypertension: Secondary | ICD-10-CM | POA: Diagnosis not present

## 2018-12-19 DIAGNOSIS — Z0001 Encounter for general adult medical examination with abnormal findings: Secondary | ICD-10-CM

## 2018-12-19 DIAGNOSIS — Z13820 Encounter for screening for osteoporosis: Secondary | ICD-10-CM

## 2018-12-19 DIAGNOSIS — Z1231 Encounter for screening mammogram for malignant neoplasm of breast: Secondary | ICD-10-CM

## 2018-12-19 MED ORDER — LINACLOTIDE 290 MCG PO CAPS: 290.0000 ug | ORAL_CAPSULE | Freq: Every day | ORAL | 5 refills | Status: DC

## 2018-12-19 MED ORDER — CLONAZEPAM 0.5 MG PO TABS: 0.5000 mg | ORAL_TABLET | Freq: Two times a day (BID) | ORAL | 3 refills | Status: DC | PRN

## 2018-12-19 NOTE — Progress Notes (Signed)
[facility] [place], [place] [address]  Internal MEDICINE  Office Visit Note  Patient Name: Jasmine Barron  [id number]  [id number]  Date of Service: 01/03/2019    Pt is here for routine health maintenance examination   Chief Complaint  Patient presents with  . Annual Exam  . Gynecologic Exam  . Hypertension  . Hyperlipidemia  . Anxiety  . Quality Metric Gaps    colonoscopy, mammogram, dexa, flu vacc, pna vacc     The patient is here for health maintenance exam and pap smear.  She continues to have increased depression and anxiety. Has several personal issues all happening at once. Also remote teaching fifth grade adding stress as well. Feels overwhelmed at times. States that she is still having more good days than bad, overall.  She has no other physical concerns or complaints at this time.  She is due to get screening mammogram and bone density tests.     Current Medication: Outpatient Encounter Medications as of 12/19/2018  Medication Sig  . citalopram (CELEXA) 40 MG tablet Take 1 tablet (40 mg total) by mouth daily.  . clonazePAM (KLONOPIN) 0.5 MG tablet Take 1 tablet (0.5 mg total) by mouth 2 (two) times daily as needed for anxiety.  Marland Kitchen linaclotide (LINZESS) 290 MCG CAPS capsule Take 1 capsule (290 mcg total) by mouth daily.  Marland Kitchen lisinopril (ZESTRIL) 5 MG tablet Take 1 tablet (5 mg total) by mouth daily.  . [DISCONTINUED] clonazePAM (KLONOPIN) 0.5 MG tablet Take 1 tablet (0.5 mg total) by mouth 2 (two) times daily as needed for anxiety.  . [DISCONTINUED] linaclotide (LINZESS) 145 MCG CAPS capsule Take 1 capsule (145 mcg total) by mouth daily.  . [DISCONTINUED] simvastatin (ZOCOR) 20 MG tablet Take 1 tablet (20 mg total) by mouth at bedtime.   No facility-administered encounter medications on file as of 12/19/2018.     Surgical History: History reviewed. No pertinent surgical history.  Medical History: Past Medical History:  Diagnosis Date  .  Anxiety   . High cholesterol   . Hypertension     Family History: Family History  Problem Relation Age of Onset  . Diabetes Mother   . Heart disease Mother   . Hypertension Mother   . Stroke Father   . Diabetes Brother   . Diabetes Maternal Grandmother       Review of Systems  Constitutional: Negative for activity change, chills, fatigue and unexpected weight change.  HENT: Negative for congestion, postnasal drip, rhinorrhea, sneezing and sore throat.   Respiratory: Negative for cough, chest tightness, shortness of breath and wheezing.   Cardiovascular: Negative for chest pain and palpitations.  Gastrointestinal: Positive for constipation. Negative for abdominal pain, diarrhea and nausea.       Chronic   Endocrine: Negative for cold intolerance, heat intolerance, polydipsia and polyuria.  Genitourinary: Negative for dysuria, frequency and urgency.  Musculoskeletal: Negative for arthralgias, back pain, joint swelling and neck pain.  Skin: Negative for rash.  Allergic/Immunologic: Negative for environmental allergies.  Neurological: Negative for dizziness, tremors, numbness and headaches.  Hematological: Negative for adenopathy. Does not bruise/bleed easily.  Psychiatric/Behavioral: Positive for dysphoric mood. Negative for behavioral problems (Depression), sleep disturbance and suicidal ideas. The patient is nervous/anxious.      Today's Vitals   12/19/18 1408  BP: 119/60  Pulse: 83  Resp: 16  Temp: (!) 97.3 F (36.3 C)  SpO2: 100%  Weight: 147 lb (66.7 kg)  Height: 5\' 5"  (1.651 m)  Body mass index is 24.46 kg/m.  Physical Exam Vitals signs and nursing note reviewed.  Constitutional:      General: She is not in acute distress.    Appearance: Normal appearance. She is well-developed. She is not diaphoretic.  HENT:     Head: Normocephalic and atraumatic.     Nose: Nose normal.     Mouth/Throat:     Pharynx: No oropharyngeal exudate.  Eyes:      Conjunctiva/sclera: Conjunctivae normal.     Pupils: Pupils are equal, round, and reactive to light.  Neck:     Musculoskeletal: Normal range of motion and neck supple.     Thyroid: No thyromegaly.     Vascular: No carotid bruit or JVD.     Trachea: No tracheal deviation.  Cardiovascular:     Rate and Rhythm: Normal rate and regular rhythm.     Pulses: Normal pulses.     Heart sounds: Normal heart sounds. No murmur. No friction rub. No gallop.   Pulmonary:     Effort: Pulmonary effort is normal. No respiratory distress.     Breath sounds: Normal breath sounds. No wheezing or rales.  Chest:     Chest wall: No tenderness.     Breasts:        Right: Normal. No swelling, bleeding, inverted nipple, mass, nipple discharge, skin change or tenderness.        Left: Normal. No swelling, bleeding, inverted nipple, mass, nipple discharge, skin change or tenderness.  Abdominal:     General: Bowel sounds are normal.     Palpations: Abdomen is soft.     Tenderness: There is no abdominal tenderness.     Hernia: There is no hernia in the left inguinal area or right inguinal area.  Genitourinary:    General: Normal vulva.     Exam position: Supine.     Labia:        Right: No tenderness.        Left: No tenderness.      Vagina: Normal. No vaginal discharge, erythema or tenderness.     Cervix: No discharge, friability or erythema.     Uterus: Normal.      Adnexa: Right adnexa normal and left adnexa normal.     Comments: No tenderness, masses, or organomeglay present during bimanual exam . Musculoskeletal: Normal range of motion.  Lymphadenopathy:     Cervical: No cervical adenopathy.     Upper Body:     Right upper body: No supraclavicular or axillary adenopathy.     Left upper body: No supraclavicular or axillary adenopathy.     Lower Body: No right inguinal adenopathy. No left inguinal adenopathy.  Skin:    General: Skin is warm and dry.     Capillary Refill: Capillary refill takes less  than 2 seconds.  Neurological:     Mental Status: She is alert and oriented to person, place, and time.     Cranial Nerves: No cranial nerve deficit.  Psychiatric:        Behavior: Behavior normal.        Thought Content: Thought content normal.        Judgment: Judgment normal.      LABS: Recent Results (from the past 2160 hour(s))  Pap IG and HPV (high risk) DNA detection     Status: None   Collection Time: 12/19/18  2:00 PM  Result Value Ref Range   Interpretation NILM,QC     Comment: NEGATIVE FOR INTRAEPITHELIAL LESION  OR MALIGNANCY. THIS SPECIMEN WAS RESCREENED AS PART OF OUR QUALITY CONTROL PROGRAM.    Category NIL     Comment: Negative for Intraepithelial Lesion   Adequacy ENDO     Comment: Satisfactory for evaluation. Endocervical and/or squamous metaplastic cells (endocervical component) are present.    Clinician Provided ICD10 Comment     Comment: Z12.4   Performed by: Comment     Comment: Windell Norfolk, Cytotechnologist (ASCP)   QC reviewed by: Comment     Comment: Lamar Benes, Cytotechnologist (ASCP)   Note: Comment     Comment: The Pap smear is a screening test designed to aid in the detection of premalignant and malignant conditions of the uterine cervix.  It is not a diagnostic procedure and should not be used as the sole means of detecting cervical cancer.  Both false-positive and false-negative reports do occur.    Test Methodology Comment     Comment: This liquid based ThinPrep(R) pap test was screened with the use of an image guided system.    HPV, high-risk Negative Negative    Comment: This nucleic acid amplification high-risk HPV test detects thirteen high-risk types (16,18,31,33,35,39,45,51,52,56,58,59,68) without differentiation.   UA/M w/rflx Culture, Routine     Status: None   Collection Time: 12/19/18  2:00 PM   Specimen: Urine   URINE  Result Value Ref Range   Specific Gravity, UA CANCELED     Comment: Quantity was not sufficient for  analysis.  Result canceled by the ancillary.    pH, UA CANCELED     Comment: Test not performed  Result canceled by the ancillary.    Protein,UA CANCELED     Comment: Test not performed  Result canceled by the ancillary.    Glucose, UA CANCELED     Comment: Test not performed  Result canceled by the ancillary.    Ketones, UA CANCELED     Comment: Test not performed  Result canceled by the ancillary.     Assessment/Plan: 1. Encounter for general adult medical examination with abnormal findings Annual health maintenance exam with pap smear today.   2. Chronic constipation Continue linzess 260mcg daily. Continue high fiber diet and increase water intake.  - linaclotide (LINZESS) 290 MCG CAPS capsule; Take 1 capsule (290 mcg total) by mouth daily.  Dispense: 30 capsule; Refill: 5  3. Essential hypertension Stable. Continue bp medication as prescribed   4. Anxiety Stable. Continue citalopram 40mg  daily. May take clonazepam 0.5mg  twice daily if needed for acute anxiety.  - clonazePAM (KLONOPIN) 0.5 MG tablet; Take 1 tablet (0.5 mg total) by mouth 2 (two) times daily as needed for anxiety.  Dispense: 60 tablet; Refill: 3  5. Routine cervical smear - Pap IG and HPV (high risk) DNA detection  6. Screening for breast cancer - MM DIGITAL SCREENING BILATERAL; Future  7. Screening for osteoporosis - DG Bone Density; Future  8. Dysuria - UA/M w/rflx Culture, Routine  General Counseling: Jasmine Barron verbalizes understanding of the findings of todays visit and agrees with plan of treatment. I have discussed any further diagnostic evaluation that may be needed or ordered today. We also reviewed her medications today. she has been encouraged to call the office with any questions or concerns that should arise related to todays visit.    Counseling:  This patient was seen by Leretha Pol FNP Collaboration with Dr Lavera Guise as a part of collaborative care agreement  Orders Placed  This Encounter  Procedures  . DG Bone Density  .  MM DIGITAL SCREENING BILATERAL  . UA/M w/rflx Culture, Routine    Meds ordered this encounter  Medications  . clonazePAM (KLONOPIN) 0.5 MG tablet    Sig: Take 1 tablet (0.5 mg total) by mouth 2 (two) times daily as needed for anxiety.    Dispense:  60 tablet    Refill:  3    Order Specific Question:   Supervising Provider    Answer:   Lavera Guise [id number]  . linaclotide (LINZESS) 290 MCG CAPS capsule    Sig: Take 1 capsule (290 mcg total) by mouth daily.    Dispense:  30 capsule    Refill:  5    Please increase dose to 229mcg daily.    Order Specific Question:   Supervising Provider    Answer:   Lavera Guise [id number]    Time spent: [place], MD  Internal Medicine

## 2018-12-22 LAB — UA/M W/RFLX CULTURE, ROUTINE

## 2018-12-25 ENCOUNTER — Other Ambulatory Visit: Payer: Self-pay

## 2018-12-25 DIAGNOSIS — E782 Mixed hyperlipidemia: Secondary | ICD-10-CM

## 2018-12-25 MED ORDER — SIMVASTATIN 20 MG PO TABS: 20.0000 mg | ORAL_TABLET | Freq: Every day | ORAL | 5 refills | Status: DC

## 2018-12-30 LAB — PAP IG AND HPV HIGH-RISK: HPV, high-risk: NEGATIVE

## 2019-01-03 DIAGNOSIS — Z124 Encounter for screening for malignant neoplasm of cervix: Secondary | ICD-10-CM | POA: Insufficient documentation

## 2019-01-03 DIAGNOSIS — Z13820 Encounter for screening for osteoporosis: Secondary | ICD-10-CM | POA: Insufficient documentation

## 2019-01-03 DIAGNOSIS — Z0001 Encounter for general adult medical examination with abnormal findings: Secondary | ICD-10-CM | POA: Insufficient documentation

## 2019-04-09 ENCOUNTER — Other Ambulatory Visit: Payer: Self-pay

## 2019-04-09 DIAGNOSIS — F321 Major depressive disorder, single episode, moderate: Secondary | ICD-10-CM

## 2019-04-09 MED ORDER — CITALOPRAM HYDROBROMIDE 40 MG PO TABS: 40.0000 mg | ORAL_TABLET | Freq: Every day | ORAL | 1 refills | Status: DC

## 2019-06-14 ENCOUNTER — Telehealth: Payer: Self-pay

## 2019-06-14 NOTE — Telephone Encounter (Signed)
LMOM TO CONFIRM AND SCREEN FOR 06-18-19 OV.

## 2019-06-15 ENCOUNTER — Other Ambulatory Visit: Payer: Self-pay | Admitting: Internal Medicine

## 2019-06-15 DIAGNOSIS — F321 Major depressive disorder, single episode, moderate: Secondary | ICD-10-CM

## 2019-06-18 ENCOUNTER — Other Ambulatory Visit: Payer: Self-pay

## 2019-06-18 ENCOUNTER — Ambulatory Visit (INDEPENDENT_AMBULATORY_CARE_PROVIDER_SITE_OTHER): Payer: BC Managed Care – PPO | Admitting: Nurse Practitioner

## 2019-06-18 ENCOUNTER — Encounter: Payer: Self-pay | Admitting: Nurse Practitioner

## 2019-06-18 DIAGNOSIS — I10 Essential (primary) hypertension: Secondary | ICD-10-CM | POA: Diagnosis not present

## 2019-06-18 DIAGNOSIS — E782 Mixed hyperlipidemia: Secondary | ICD-10-CM | POA: Diagnosis not present

## 2019-06-18 DIAGNOSIS — K5909 Other constipation: Secondary | ICD-10-CM

## 2019-06-18 DIAGNOSIS — F321 Major depressive disorder, single episode, moderate: Secondary | ICD-10-CM

## 2019-06-18 DIAGNOSIS — F419 Anxiety disorder, unspecified: Secondary | ICD-10-CM

## 2019-06-18 MED ORDER — SIMVASTATIN 20 MG PO TABS: 20.0000 mg | ORAL_TABLET | Freq: Every day | ORAL | 5 refills | Status: DC

## 2019-06-18 MED ORDER — LINACLOTIDE 290 MCG PO CAPS: 290.0000 ug | ORAL_CAPSULE | Freq: Every day | ORAL | 5 refills | Status: DC

## 2019-06-18 MED ORDER — LISINOPRIL 5 MG PO TABS: 5.0000 mg | ORAL_TABLET | Freq: Every day | ORAL | 5 refills | Status: DC

## 2019-06-18 MED ORDER — CLONAZEPAM 0.5 MG PO TABS: 0.5000 mg | ORAL_TABLET | Freq: Two times a day (BID) | ORAL | 3 refills | Status: DC | PRN

## 2019-06-18 MED ORDER — CITALOPRAM HYDROBROMIDE 40 MG PO TABS: 40.0000 mg | ORAL_TABLET | Freq: Every day | ORAL | 3 refills | Status: DC

## 2019-06-18 NOTE — Progress Notes (Signed)
[facility] [place], [place] [address]  Internal MEDICINE  Office Visit Note  Patient Name: Jasmine Barron  [id number]  [id number]  Date of Service: 06/27/2019  Chief Complaint  Patient presents with  . Hypertension  . Hyperlipidemia  . Anxiety    The patient presents for follow up visit. She states that she is doing well. She has no concerns or complaints. She is due to have routine, fasting labs done. She is due to have both mammogram and bone density tests. These have been ordered but need to be scheduled. She has history of depression and generalized anxiety. These are well managed with current medications. She does need to have refills for these today.        Current Medication: Outpatient Encounter Medications as of 06/18/2019  Medication Sig  . citalopram (CELEXA) 40 MG tablet Take 1 tablet (40 mg total) by mouth daily.  . clonazePAM (KLONOPIN) 0.5 MG tablet Take 1 tablet (0.5 mg total) by mouth 2 (two) times daily as needed for anxiety.  Marland Kitchen linaclotide (LINZESS) 290 MCG CAPS capsule Take 1 capsule (290 mcg total) by mouth daily.  Marland Kitchen lisinopril (ZESTRIL) 5 MG tablet Take 1 tablet (5 mg total) by mouth daily.  . simvastatin (ZOCOR) 20 MG tablet Take 1 tablet (20 mg total) by mouth at bedtime.  . [DISCONTINUED] citalopram (CELEXA) 40 MG tablet Take 1 tablet by mouth once daily  . [DISCONTINUED] clonazePAM (KLONOPIN) 0.5 MG tablet Take 1 tablet (0.5 mg total) by mouth 2 (two) times daily as needed for anxiety.  . [DISCONTINUED] linaclotide (LINZESS) 290 MCG CAPS capsule Take 1 capsule (290 mcg total) by mouth daily.  . [DISCONTINUED] lisinopril (ZESTRIL) 5 MG tablet Take 1 tablet (5 mg total) by mouth daily.  . [DISCONTINUED] simvastatin (ZOCOR) 20 MG tablet Take 1 tablet (20 mg total) by mouth at bedtime.   No facility-administered encounter medications on file as of 06/18/2019.    Surgical History: History reviewed. No pertinent surgical  history.  Medical History: Past Medical History:  Diagnosis Date  . Anxiety   . High cholesterol   . Hypertension     Family History: Family History  Problem Relation Age of Onset  . Diabetes Mother   . Heart disease Mother   . Hypertension Mother   . Stroke Father   . Diabetes Brother   . Diabetes Maternal Grandmother     Social History   Socioeconomic History  . Marital status: Divorced    Spouse name: Not on file  . Number of children: Not on file  . Years of education: Not on file  . Highest education level: Not on file  Occupational History  . Not on file  Tobacco Use  . Smoking status: Never Smoker  . Smokeless tobacco: Never Used  Substance and Sexual Activity  . Alcohol use: Yes    Alcohol/week: 0.0 standard drinks  . Drug use: No  . Sexual activity: Not on file  Other Topics Concern  . Not on file  Social History Narrative  . Not on file   Social Determinants of Health   Financial Resource Strain:   . Difficulty of Paying Living Expenses:   Food Insecurity:   . Worried About Charity fundraiser in the Last Year:   . Arboriculturist in the Last Year:   Transportation Needs:   . Film/video editor (Medical):   Marland Kitchen Lack of Transportation (Non-Medical):   Physical Activity:   .  Days of Exercise per Week:   . Minutes of Exercise per Session:   Stress:   . Feeling of Stress :   Social Connections:   . Frequency of Communication with Friends and Family:   . Frequency of Social Gatherings with Friends and Family:   . Attends Religious Services:   . Active Member of Clubs or Organizations:   . Attends Archivist Meetings:   Marland Kitchen Marital Status:   Intimate Partner Violence:   . Fear of Current or Ex-Partner:   . Emotionally Abused:   Marland Kitchen Physically Abused:   . Sexually Abused:       Review of Systems  Constitutional: Positive for fatigue. Negative for activity change, chills and unexpected weight change.  HENT: Negative for  congestion, postnasal drip, rhinorrhea, sneezing and sore throat.   Respiratory: Negative for cough, chest tightness, shortness of breath and wheezing.   Cardiovascular: Negative for chest pain and palpitations.  Gastrointestinal: Positive for constipation. Negative for abdominal pain, diarrhea and nausea.       Chronic. Does well with linzess 290 mcg daily.    Endocrine: Negative for cold intolerance, heat intolerance, polydipsia and polyuria.  Genitourinary: Negative for urgency.  Musculoskeletal: Negative for arthralgias, back pain, joint swelling and neck pain.  Skin: Negative for rash.  Allergic/Immunologic: Negative for environmental allergies.  Neurological: Negative for dizziness, tremors, numbness and headaches.  Hematological: Negative for adenopathy. Does not bruise/bleed easily.  Psychiatric/Behavioral: Positive for dysphoric mood. Negative for behavioral problems (Depression), sleep disturbance and suicidal ideas. The patient is nervous/anxious.     Today's Vitals   06/18/19 1604  BP: 132/73  Pulse: 72  Resp: 16  Temp: (!) 97.1 F (36.2 C)  SpO2: 99%  Weight: 155 lb 9.6 oz (70.6 kg)  Height: 5\' 5"  (1.651 m)   Body mass index is 25.89 kg/m.  Physical Exam Vitals and nursing note reviewed.  Constitutional:      General: She is not in acute distress.    Appearance: Normal appearance. She is well-developed. She is not diaphoretic.  HENT:     Head: Normocephalic and atraumatic.     Nose: Nose normal.     Mouth/Throat:     Pharynx: No oropharyngeal exudate.  Eyes:     Conjunctiva/sclera: Conjunctivae normal.     Pupils: Pupils are equal, round, and reactive to light.  Neck:     Thyroid: No thyromegaly.     Vascular: No carotid bruit or JVD.     Trachea: No tracheal deviation.  Cardiovascular:     Rate and Rhythm: Normal rate and regular rhythm.     Heart sounds: Normal heart sounds. No murmur. No friction rub. No gallop.   Pulmonary:     Effort: Pulmonary  effort is normal. No respiratory distress.     Breath sounds: Normal breath sounds. No wheezing or rales.  Chest:     Chest wall: No tenderness.  Abdominal:     Palpations: Abdomen is soft.  Musculoskeletal:        General: Normal range of motion.     Cervical back: Normal range of motion and neck supple.  Lymphadenopathy:     Cervical: No cervical adenopathy.  Skin:    General: Skin is warm and dry.     Capillary Refill: Capillary refill takes less than 2 seconds.  Neurological:     Mental Status: She is alert and oriented to person, place, and time.     Cranial Nerves: No cranial nerve deficit.  Psychiatric:        Mood and Affect: Mood normal.        Behavior: Behavior normal.        Thought Content: Thought content normal.        Judgment: Judgment normal.     Assessment/Plan: 1. Essential hypertension Blood pressure stable. Continue bp medication as prescribed  - lisinopril (ZESTRIL) 5 MG tablet; Take 1 tablet (5 mg total) by mouth daily.  Dispense: 30 tablet; Refill: 5  2. Mixed hyperlipidemia Check fasting lipid panel. Adjust dosing of simvastatin as indicated.  - simvastatin (ZOCOR) 20 MG tablet; Take 1 tablet (20 mg total) by mouth at bedtime.  Dispense: 30 tablet; Refill: 5  3. Chronic constipation May continue linzess 277mcg daily.  - linaclotide (LINZESS) 290 MCG CAPS capsule; Take 1 capsule (290 mcg total) by mouth daily.  Dispense: 30 capsule; Refill: 5  4. Moderate major depression (HCC) Stable. Continue celexa 40mg  daily.  - citalopram (CELEXA) 40 MG tablet; Take 1 tablet (40 mg total) by mouth daily.  Dispense: 30 tablet; Refill: 3  5. Anxiety May take clonazepam 0.5mg  up to twice daily as needed for acute anxiety. New prescription sent to her pharmacy today.  - clonazePAM (KLONOPIN) 0.5 MG tablet; Take 1 tablet (0.5 mg total) by mouth 2 (two) times daily as needed for anxiety.  Dispense: 60 tablet; Refill: 3  General Counseling: Camill verbalizes  understanding of the findings of todays visit and agrees with plan of treatment. I have discussed any further diagnostic evaluation that may be needed or ordered today. We also reviewed her medications today. she has been encouraged to call the office with any questions or concerns that should arise related to todays visit.   This patient was seen by [place] with Dr Lavera Guise as a part of collaborative care agreement  Meds ordered this encounter  Medications  . citalopram (CELEXA) 40 MG tablet    Sig: Take 1 tablet (40 mg total) by mouth daily.    Dispense:  30 tablet    Refill:  3    Order Specific Question:   Supervising Provider    Answer:   Lavera Guise [id number]  . clonazePAM (KLONOPIN) 0.5 MG tablet    Sig: Take 1 tablet (0.5 mg total) by mouth 2 (two) times daily as needed for anxiety.    Dispense:  60 tablet    Refill:  3    Order Specific Question:   Supervising Provider    Answer:   Lavera Guise [id number]  . lisinopril (ZESTRIL) 5 MG tablet    Sig: Take 1 tablet (5 mg total) by mouth daily.    Dispense:  30 tablet    Refill:  5    Order Specific Question:   Supervising Provider    Answer:   Lavera Guise [id number]  . simvastatin (ZOCOR) 20 MG tablet    Sig: Take 1 tablet (20 mg total) by mouth at bedtime.    Dispense:  30 tablet    Refill:  5    Please consider 90 day supplies to promote better adherence    Order Specific Question:   Supervising Provider    Answer:   Lavera Guise [place]  . linaclotide (LINZESS) 290 MCG CAPS capsule    Sig: Take 1 capsule (290 mcg total) by mouth daily.    Dispense:  30 capsule    Refill:  5    Please increase dose to 237mcg  daily.    Order Specific Question:   Supervising Provider    Answer:   Lavera Guise [id number]    Total time spent: 20 Minutes   Time spent includes review of chart, medications, test results, and follow up plan with the patient.      Dr Lavera Guise Internal medicine

## 2019-10-08 ENCOUNTER — Other Ambulatory Visit: Payer: Self-pay | Admitting: Nurse Practitioner

## 2019-10-09 LAB — COMPREHENSIVE METABOLIC PANEL
ALT: 23 IU/L (ref 0–32)
AST: 23 IU/L (ref 0–40)
Albumin/Globulin Ratio: 1.7 (ref 1.2–2.2)
Albumin: 4.4 g/dL (ref 3.8–4.8)
Alkaline Phosphatase: 67 IU/L (ref 48–121)
BUN/Creatinine Ratio: 34 — ABNORMAL HIGH (ref 12–28)
BUN: 18 mg/dL (ref 8–27)
Bilirubin Total: 0.4 mg/dL (ref 0.0–1.2)
CO2: 24 mmol/L (ref 20–29)
Calcium: 9.5 mg/dL (ref 8.7–10.3)
Chloride: 106 mmol/L (ref 96–106)
Creatinine, Ser: 0.53 mg/dL — ABNORMAL LOW (ref 0.57–1.00)
GFR calc Af Amer: 114 mL/min/{1.73_m2} (ref >59)
GFR calc non Af Amer: 99 mL/min/{1.73_m2} (ref >59)
Globulin, Total: 2.6 g/dL (ref 1.5–4.5)
Glucose: 97 mg/dL (ref 65–99)
Potassium: 4 mmol/L (ref 3.5–5.2)
Sodium: 142 mmol/L (ref 134–144)
Total Protein: 7 g/dL (ref 6.0–8.5)

## 2019-10-09 LAB — CBC
Hematocrit: 39.1 % (ref 34.0–46.6)
Hemoglobin: 13.7 g/dL (ref 11.1–15.9)
MCH: 30.8 pg (ref 26.6–33.0)
MCHC: 35 g/dL (ref 31.5–35.7)
MCV: 88 fL (ref 79–97)
Platelets: 234 10*3/uL (ref 150–450)
RBC: 4.45 x10E6/uL (ref 3.77–5.28)
RDW: 12.7 % (ref 11.7–15.4)
WBC: 3.9 10*3/uL (ref 3.4–10.8)

## 2019-10-09 LAB — LIPID PANEL W/O CHOL/HDL RATIO
Cholesterol, Total: 182 mg/dL (ref 100–199)
HDL: 58 mg/dL (ref >39)
LDL Chol Calc (NIH): 101 mg/dL — ABNORMAL HIGH (ref 0–99)
Triglycerides: 130 mg/dL (ref 0–149)
VLDL Cholesterol Cal: 23 mg/dL (ref 5–40)

## 2019-10-09 LAB — VITAMIN D 25 HYDROXY (VIT D DEFICIENCY, FRACTURES): Vit D, 25-Hydroxy: 31.5 ng/mL (ref 30.0–100.0)

## 2019-10-09 LAB — TSH: TSH: 2.41 u[IU]/mL (ref 0.450–4.500)

## 2019-10-09 LAB — T4, FREE: Free T4: 0.97 ng/dL (ref 0.82–1.77)

## 2019-10-14 NOTE — Progress Notes (Signed)
Labs good. Discuss with visit 10/18/2019

## 2019-10-16 ENCOUNTER — Telehealth: Payer: Self-pay

## 2019-10-16 NOTE — Telephone Encounter (Signed)
CONFIRMED PT APPT-OFFICE-AR 

## 2019-10-18 ENCOUNTER — Other Ambulatory Visit: Payer: Self-pay

## 2019-10-18 ENCOUNTER — Ambulatory Visit (INDEPENDENT_AMBULATORY_CARE_PROVIDER_SITE_OTHER): Payer: BC Managed Care – PPO | Admitting: Nurse Practitioner

## 2019-10-18 ENCOUNTER — Encounter: Payer: Self-pay | Admitting: Nurse Practitioner

## 2019-10-18 VITALS — BP 133/88 | HR 74 | Temp 97.3°F | Resp 16 | Ht 65.0 in | Wt 152.7 lb

## 2019-10-18 DIAGNOSIS — I10 Essential (primary) hypertension: Secondary | ICD-10-CM | POA: Diagnosis not present

## 2019-10-18 DIAGNOSIS — F419 Anxiety disorder, unspecified: Secondary | ICD-10-CM

## 2019-10-18 DIAGNOSIS — E782 Mixed hyperlipidemia: Secondary | ICD-10-CM

## 2019-10-18 DIAGNOSIS — F321 Major depressive disorder, single episode, moderate: Secondary | ICD-10-CM

## 2019-10-18 MED ORDER — CLONAZEPAM 0.5 MG PO TABS: 0.5000 mg | ORAL_TABLET | Freq: Two times a day (BID) | ORAL | 3 refills | Status: DC | PRN

## 2019-10-18 MED ORDER — CITALOPRAM HYDROBROMIDE 40 MG PO TABS: 40.0000 mg | ORAL_TABLET | Freq: Every day | ORAL | 3 refills | Status: DC

## 2019-10-18 NOTE — Progress Notes (Signed)
[facility] [place], [place] [address]  Internal MEDICINE  Office Visit Note  Patient Name: Jasmine Barron  [id number]  [id number]  Date of Service: 11/14/2019  Chief Complaint  Patient presents with   Follow-up   Hyperlipidemia   Hypertension   Quality Metric Gaps    TDAP, PNA Vaccine   Leg Pain    has to push up on something to stand up    The patient presents for follow up visit. She states that she is doing well. She has no concerns or complaints. She had routine labs done prior to this visit. LDL was 101 and lipid panel was otherwise normal. All other labs were within normal limits. She is still due to have both mammogram and bone density tests. These have been ordered but need to be scheduled. States that she has putting off these tests until spread of COVID 19 has declined. She has history of depression and generalized anxiety. These are well managed with current medications. She does need to have refills for these today.       Current Medication: Outpatient Encounter Medications as of 10/18/2019  Medication Sig   citalopram (CELEXA) 40 MG tablet Take 1 tablet (40 mg total) by mouth daily.   clonazePAM (KLONOPIN) 0.5 MG tablet Take 1 tablet (0.5 mg total) by mouth 2 (two) times daily as needed for anxiety.   linaclotide (LINZESS) 290 MCG CAPS capsule Take 1 capsule (290 mcg total) by mouth daily.   lisinopril (ZESTRIL) 5 MG tablet Take 1 tablet (5 mg total) by mouth daily.   simvastatin (ZOCOR) 20 MG tablet Take 1 tablet (20 mg total) by mouth at bedtime.   [DISCONTINUED] citalopram (CELEXA) 40 MG tablet Take 1 tablet (40 mg total) by mouth daily.   [DISCONTINUED] clonazePAM (KLONOPIN) 0.5 MG tablet Take 1 tablet (0.5 mg total) by mouth 2 (two) times daily as needed for anxiety.   No facility-administered encounter medications on file as of 10/18/2019.    Surgical History: History reviewed. No pertinent surgical history.  Medical  History: Past Medical History:  Diagnosis Date   Anxiety    High cholesterol    Hypertension     Family History: Family History  Problem Relation Age of Onset   Diabetes Mother    Heart disease Mother    Hypertension Mother    Stroke Father    Diabetes Brother    Diabetes Maternal Grandmother     Social History   Socioeconomic History   Marital status: Divorced    Spouse name: Not on file   Number of children: Not on file   Years of education: Not on file   Highest education level: Not on file  Occupational History   Not on file  Tobacco Use   Smoking status: Never Smoker   Smokeless tobacco: Never Used  Substance and Sexual Activity   Alcohol use: Yes    Alcohol/week: 0.0 standard drinks   Drug use: No   Sexual activity: Not on file  Other Topics Concern   Not on file  Social History Narrative   Not on file   Social Determinants of Health   Financial Resource Strain:    Difficulty of Paying Living Expenses:   Food Insecurity:    Worried About [place] in the Last Year:    Arboriculturist in the Last Year:   Transportation Needs:    Film/video editor (Medical):    Lack of Transportation (  Non-Medical):   Physical Activity:    Days of Exercise per Week:    Minutes of Exercise per Session:   Stress:    Feeling of Stress :   Social Connections:    Frequency of Communication with Friends and Family:    Frequency of Social Gatherings with Friends and Family:    Attends Religious Services:    Active Member of Clubs or Organizations:    Attends Music therapist:    Marital Status:   Intimate Partner Violence:    Fear of Current or Ex-Partner:    Emotionally Abused:    Physically Abused:    Sexually Abused:       Review of Systems  Constitutional: Negative for activity change, chills, fatigue and unexpected weight change.  HENT: Negative for congestion, postnasal drip, rhinorrhea,  sneezing and sore throat.   Respiratory: Negative for cough, chest tightness, shortness of breath and wheezing.   Cardiovascular: Negative for chest pain and palpitations.  Gastrointestinal: Positive for constipation. Negative for abdominal pain, diarrhea and nausea.       Chronic. Does well with linzess 290 mcg daily.    Endocrine: Negative for cold intolerance, heat intolerance, polydipsia and polyuria.  Genitourinary: Negative for urgency.  Musculoskeletal: Negative for arthralgias, back pain, joint swelling and neck pain.  Skin: Negative for rash.  Allergic/Immunologic: Negative for environmental allergies.  Neurological: Negative for dizziness, tremors, numbness and headaches.  Hematological: Negative for adenopathy. Does not bruise/bleed easily.  Psychiatric/Behavioral: Positive for dysphoric mood. Negative for behavioral problems (Depression), sleep disturbance and suicidal ideas. The patient is nervous/anxious.    Today's Vitals   10/18/19 1549  BP: (!) 133/88  Pulse: 74  Resp: 16  Temp: (!) 97.3 F (36.3 C)  SpO2: 97%  Weight: 152 lb 11.2 oz (69.3 kg)  Height: 5\' 5"  (1.651 m)   Body mass index is 25.41 kg/m.  Physical Exam Vitals and nursing note reviewed.  Constitutional:      General: She is not in acute distress.    Appearance: Normal appearance. She is well-developed. She is not diaphoretic.  HENT:     Head: Normocephalic and atraumatic.     Nose: Nose normal.     Mouth/Throat:     Pharynx: No oropharyngeal exudate.  Eyes:     Conjunctiva/sclera: Conjunctivae normal.     Pupils: Pupils are equal, round, and reactive to light.  Neck:     Thyroid: No thyromegaly.     Vascular: No carotid bruit or JVD.     Trachea: No tracheal deviation.  Cardiovascular:     Rate and Rhythm: Normal rate and regular rhythm.     Heart sounds: Normal heart sounds. No murmur heard.  No friction rub. No gallop.   Pulmonary:     Effort: Pulmonary effort is normal. No respiratory  distress.     Breath sounds: Normal breath sounds. No wheezing or rales.  Chest:     Chest wall: No tenderness.  Abdominal:     Palpations: Abdomen is soft.  Musculoskeletal:        General: Normal range of motion.     Cervical back: Normal range of motion and neck supple.  Lymphadenopathy:     Cervical: No cervical adenopathy.  Skin:    General: Skin is warm and dry.     Capillary Refill: Capillary refill takes less than 2 seconds.  Neurological:     Mental Status: She is alert and oriented to person, place, and time.  Cranial Nerves: No cranial nerve deficit.  Psychiatric:        Mood and Affect: Mood normal.        Behavior: Behavior normal.        Thought Content: Thought content normal.        Judgment: Judgment normal.    Assessment/Plan: 1. Essential hypertension Stable. Continue bp medication as prescribed   2. Mixed hyperlipidemia Reviewed labs with stable cholesterol panel. Continue simvastatin as prescribed   3. Anxiety May conitnue clonazepam 0.5mg  up to twice daily as needed for acute anxiety. New prescription provided today.  - clonazePAM (KLONOPIN) 0.5 MG tablet; Take 1 tablet (0.5 mg total) by mouth 2 (two) times daily as needed for anxiety.  Dispense: 60 tablet; Refill: 3  4. Moderate major depression (HCC) Continue citalopram as prescribed  - citalopram (CELEXA) 40 MG tablet; Take 1 tablet (40 mg total) by mouth daily.  Dispense: 30 tablet; Refill: 3  General Counseling: Kataleyah verbalizes understanding of the findings of todays visit and agrees with plan of treatment. I have discussed any further diagnostic evaluation that may be needed or ordered today. We also reviewed her medications today. she has been encouraged to call the office with any questions or concerns that should arise related to todays visit.   This patient was seen by Leretha Pol FNP Collaboration with Dr Lavera Guise as a part of collaborative care agreement  Meds ordered this  encounter  Medications   clonazePAM (KLONOPIN) 0.5 MG tablet    Sig: Take 1 tablet (0.5 mg total) by mouth 2 (two) times daily as needed for anxiety.    Dispense:  60 tablet    Refill:  3    Order Specific Question:   Supervising Provider    Answer:   Lavera Guise [[id number]]   citalopram (CELEXA) 40 MG tablet    Sig: Take 1 tablet (40 mg total) by mouth daily.    Dispense:  30 tablet    Refill:  3    Order Specific Question:   Supervising Provider    Answer:   Lavera Guise [[id number]]    Total time spent: 25 Minutes   Time spent includes review of chart, medications, test results, and follow up plan with the patient.      Dr Lavera Guise Internal medicine

## 2019-11-28 ENCOUNTER — Telehealth: Payer: Self-pay

## 2019-11-28 NOTE — Telephone Encounter (Signed)
Recert for Pt signed and placed in scan.

## 2019-12-07 ENCOUNTER — Other Ambulatory Visit: Payer: Self-pay | Admitting: Nurse Practitioner

## 2019-12-07 DIAGNOSIS — Z1231 Encounter for screening mammogram for malignant neoplasm of breast: Secondary | ICD-10-CM

## 2019-12-21 ENCOUNTER — Ambulatory Visit
Admission: RE | Admit: 2019-12-21 | Discharge: 2019-12-21 | Disposition: A | Discharge status: Home / Self Care | Payer: Medicare PPO | Source: Ambulatory Visit | Attending: Nurse Practitioner | Admitting: Nurse Practitioner

## 2019-12-21 ENCOUNTER — Other Ambulatory Visit: Payer: Self-pay

## 2019-12-21 DIAGNOSIS — Z1231 Encounter for screening mammogram for malignant neoplasm of breast: Secondary | ICD-10-CM | POA: Insufficient documentation

## 2019-12-24 NOTE — Progress Notes (Signed)
Negative mammogram

## 2019-12-25 ENCOUNTER — Other Ambulatory Visit: Payer: Self-pay | Admitting: *Deleted

## 2019-12-25 ENCOUNTER — Inpatient Hospital Stay
Admission: RE | Admit: 2019-12-25 | Discharge: 2019-12-25 | Disposition: A | Discharge status: Home / Self Care | Payer: Self-pay | Source: Ambulatory Visit | Attending: *Deleted | Admitting: *Deleted

## 2019-12-25 DIAGNOSIS — Z1231 Encounter for screening mammogram for malignant neoplasm of breast: Secondary | ICD-10-CM

## 2020-01-10 ENCOUNTER — Telehealth: Payer: Self-pay

## 2020-01-10 NOTE — Telephone Encounter (Signed)
Plan of care for physical therapy signed and mailed back to [facility] at [place] [address].

## 2020-01-31 ENCOUNTER — Telehealth: Payer: Self-pay

## 2020-01-31 NOTE — Telephone Encounter (Signed)
PT recert signed by provider and faxed back to [facility] at [phone].

## 2020-02-19 ENCOUNTER — Ambulatory Visit (INDEPENDENT_AMBULATORY_CARE_PROVIDER_SITE_OTHER): Payer: Medicare PPO | Admitting: Nurse Practitioner

## 2020-02-19 ENCOUNTER — Other Ambulatory Visit: Payer: Self-pay

## 2020-02-19 VITALS — BP 127/84 | HR 85 | Temp 97.9°F | Resp 16 | Ht 65.0 in | Wt 144.6 lb

## 2020-02-19 DIAGNOSIS — E782 Mixed hyperlipidemia: Secondary | ICD-10-CM

## 2020-02-19 DIAGNOSIS — F419 Anxiety disorder, unspecified: Secondary | ICD-10-CM

## 2020-02-19 DIAGNOSIS — I10 Essential (primary) hypertension: Secondary | ICD-10-CM

## 2020-02-19 DIAGNOSIS — F321 Major depressive disorder, single episode, moderate: Secondary | ICD-10-CM

## 2020-02-19 DIAGNOSIS — Z0001 Encounter for general adult medical examination with abnormal findings: Secondary | ICD-10-CM | POA: Diagnosis not present

## 2020-02-19 NOTE — Progress Notes (Signed)
[facility] [place], [place] [address]  Internal MEDICINE  Office Visit Note  Patient Name: Jasmine Barron  [id number]  [id number]  Date of Service: 03/16/2020   Pt is here for routine health maintenance examination  Chief Complaint  Patient presents with  . Medicare Wellness  . Hyperlipidemia  . Hypertension  . Quality Metric Gaps    flu,dexa scan,Hep C  . controlled substance form    reviewed with PT     The patient is here for health maintenance exam. She states that she is doing well. She denies concerns or complaints. Blood pressure is well controlled. She had routine, fasting labs done in 09/2019 and were essentially normal. She had mammogram done 12/24/2019 which was negative. She states that her anxiety/depression is well managed on daily dose of citalopram. She does take clonazepam on intermittent basis when needed for acute anxiety.     Current Medication: Outpatient Encounter Medications as of 02/19/2020  Medication Sig  . citalopram (CELEXA) 40 MG tablet Take 1 tablet (40 mg total) by mouth daily.  . clonazePAM (KLONOPIN) 0.5 MG tablet Take 1 tablet (0.5 mg total) by mouth 2 (two) times daily as needed for anxiety.  Marland Kitchen linaclotide (LINZESS) 290 MCG CAPS capsule Take 1 capsule (290 mcg total) by mouth daily.  Marland Kitchen lisinopril (ZESTRIL) 5 MG tablet Take 1 tablet (5 mg total) by mouth daily.  . simvastatin (ZOCOR) 20 MG tablet Take 1 tablet (20 mg total) by mouth at bedtime.   No facility-administered encounter medications on file as of 02/19/2020.    Surgical History: No past surgical history on file.  Medical History: Past Medical History:  Diagnosis Date  . Anxiety   . High cholesterol   . Hypertension     Family History: Family History  Problem Relation Age of Onset  . Diabetes Mother   . Heart disease Mother   . Hypertension Mother   . Stroke Father   . Diabetes Brother   . Diabetes Maternal Grandmother       Review of  Systems  Constitutional: Negative for activity change, chills, fatigue and unexpected weight change.  HENT: Negative for congestion, postnasal drip, rhinorrhea, sneezing and sore throat.   Respiratory: Negative for cough, chest tightness, shortness of breath and wheezing.   Cardiovascular: Negative for chest pain and palpitations.  Gastrointestinal: Positive for constipation. Negative for abdominal pain, diarrhea and nausea.       Chronic. Does well with linzess 290 mcg daily.    Endocrine: Negative for cold intolerance, heat intolerance, polydipsia and polyuria.  Genitourinary: Negative for dysuria, frequency and urgency.  Musculoskeletal: Negative for arthralgias, back pain, joint swelling and neck pain.  Skin: Negative for rash.  Allergic/Immunologic: Negative for environmental allergies.  Neurological: Negative for dizziness, tremors, numbness and headaches.  Hematological: Negative for adenopathy. Does not bruise/bleed easily.  Psychiatric/Behavioral: Positive for dysphoric mood. Negative for behavioral problems (Depression), sleep disturbance and suicidal ideas. The patient is nervous/anxious.     Today's Vitals   02/19/20 1009  BP: 127/84  Pulse: 85  Resp: 16  Temp: 97.9 F (36.6 C)  SpO2: 98%  Weight: 144 lb 9.6 oz (65.6 kg)  Height: 5\' 5"  (1.651 m)   Body mass index is 24.06 kg/m.  Physical Exam Vitals and nursing note reviewed.  Constitutional:      General: She is not in acute distress.    Appearance: Normal appearance. She is well-developed. She is not diaphoretic.  HENT:  Head: Normocephalic and atraumatic.     Nose: Nose normal.     Mouth/Throat:     Pharynx: No oropharyngeal exudate.  Eyes:     Conjunctiva/sclera: Conjunctivae normal.     Pupils: Pupils are equal, round, and reactive to light.  Neck:     Thyroid: No thyromegaly.     Vascular: No carotid bruit or JVD.     Trachea: No tracheal deviation.  Cardiovascular:     Rate and Rhythm: Normal  rate and regular rhythm.     Pulses: Normal pulses.     Heart sounds: Normal heart sounds. No murmur heard. No friction rub. No gallop.   Pulmonary:     Effort: Pulmonary effort is normal. No respiratory distress.     Breath sounds: Normal breath sounds. No wheezing or rales.  Chest:     Chest wall: No tenderness.  Breasts:     Right: Normal. No swelling, bleeding, inverted nipple, mass, nipple discharge, skin change, tenderness or axillary adenopathy.     Left: Normal. No swelling, bleeding, inverted nipple, mass, nipple discharge, skin change, tenderness or axillary adenopathy.    Abdominal:     General: Bowel sounds are normal.     Palpations: Abdomen is soft.     Tenderness: There is no abdominal tenderness.  Musculoskeletal:        General: Normal range of motion.     Cervical back: Normal range of motion and neck supple.  Lymphadenopathy:     Cervical: No cervical adenopathy.     Upper Body:     Right upper body: No axillary adenopathy.     Left upper body: No axillary adenopathy.  Skin:    General: Skin is warm and dry.     Capillary Refill: Capillary refill takes less than 2 seconds.  Neurological:     General: No focal deficit present.     Mental Status: She is alert and oriented to person, place, and time.     Cranial Nerves: No cranial nerve deficit.  Psychiatric:        Mood and Affect: Mood normal.        Behavior: Behavior normal.        Thought Content: Thought content normal.        Judgment: Judgment normal.    Depression screen [facility] 2/9 02/19/2020 10/18/2019 06/18/2019 12/19/2018 06/15/2018  Decreased Interest 0 0 0 0 0  Down, Depressed, Hopeless 0 0 1 0 0  PHQ - 2 Score 0 0 1 0 0    Functional Status Survey: Is the patient deaf or have difficulty hearing?: Yes Does the patient have difficulty seeing, even when wearing glasses/contacts?: Yes Does the patient have difficulty concentrating, remembering, or making decisions?: Yes Does the patient have  difficulty walking or climbing stairs?: Yes Does the patient have difficulty dressing or bathing?: No Does the patient have difficulty doing errands alone such as visiting a doctor's office or shopping?: No  MMSE - [place] Exam 02/19/2020  Orientation to time 5  Orientation to Place 5  Registration 3  Attention/ Calculation 5  Recall 3  Language- name 2 objects 2  Language- repeat 1  Language- follow 3 step command 3  Language- read & follow direction 1  Write a sentence 1  Copy design 1  Total score 30    Fall Risk  02/19/2020 10/18/2019 06/18/2019 12/19/2018 06/15/2018  Falls in the past year? 0 1 0 0 1  Number falls in past yr: -  1 - - 0  Comment - - - - PT ACCIDENTLY KNOCKED DOWN  Injury with Fall? - 1 - - 1  Comment - hurt foot. - - ONLY BRUISING     Assessment/Plan: 1. Encounter for general adult medical examination with abnormal findings Annual health maintenance exam today.   2. Essential hypertension Stable. Continue bp medication as prescribed   3. Mixed hyperlipidemia Reviewed labs done 09/2019 which showed well managed cholesterol panel. Continue simvastatin as prescribed   4. Moderate major depression ([place]) Well managed. Continue citalopram as prescribed   5. Anxiety May continue to take clonazepam as needed and as previously prescribed.   General Counseling: meliah appleman understanding of the findings of todays visit and agrees with plan of treatment. I have discussed any further diagnostic evaluation that may be needed or ordered today. We also reviewed her medications today. she has been encouraged to call the office with any questions or concerns that should arise related to todays visit.    Counseling:   This patient was seen by Leretha Pol FNP Collaboration with Dr Lavera Guise as a part of collaborative care agreement  Total time spent: 88 Minutes  Time spent includes review of chart, medications, test results, and follow up plan with  the patient.     Lavera Guise, MD  Internal Medicine

## 2020-04-28 ENCOUNTER — Other Ambulatory Visit: Payer: Self-pay

## 2020-04-28 DIAGNOSIS — F419 Anxiety disorder, unspecified: Secondary | ICD-10-CM

## 2020-04-28 DIAGNOSIS — F321 Major depressive disorder, single episode, moderate: Secondary | ICD-10-CM

## 2020-04-28 MED ORDER — CLONAZEPAM 0.5 MG PO TABS: 0.5000 mg | ORAL_TABLET | Freq: Two times a day (BID) | ORAL | 3 refills | Status: DC | PRN

## 2020-04-28 MED ORDER — CITALOPRAM HYDROBROMIDE 40 MG PO TABS: 40.0000 mg | ORAL_TABLET | Freq: Every day | ORAL | 3 refills | Status: DC

## 2020-05-02 ENCOUNTER — Other Ambulatory Visit: Payer: Self-pay

## 2020-05-02 DIAGNOSIS — F321 Major depressive disorder, single episode, moderate: Secondary | ICD-10-CM

## 2020-05-02 MED ORDER — CITALOPRAM HYDROBROMIDE 40 MG PO TABS: 40.0000 mg | ORAL_TABLET | Freq: Every day | ORAL | 3 refills | Status: DC

## 2020-06-19 ENCOUNTER — Encounter: Payer: Self-pay | Admitting: Hospice and Palliative Medicine

## 2020-06-19 ENCOUNTER — Ambulatory Visit: Payer: Medicare PPO | Admitting: Hospice and Palliative Medicine

## 2020-06-19 VITALS — BP 126/86 | HR 75 | Temp 97.3°F | Resp 16 | Ht 65.0 in | Wt 138.8 lb

## 2020-06-19 DIAGNOSIS — F419 Anxiety disorder, unspecified: Secondary | ICD-10-CM

## 2020-06-19 DIAGNOSIS — F411 Generalized anxiety disorder: Secondary | ICD-10-CM

## 2020-06-19 DIAGNOSIS — R609 Edema, unspecified: Secondary | ICD-10-CM

## 2020-06-19 DIAGNOSIS — I10 Essential (primary) hypertension: Secondary | ICD-10-CM

## 2020-06-19 MED ORDER — CLONAZEPAM 0.5 MG PO TABS
0.5000 mg | ORAL_TABLET | Freq: Every day | ORAL | 1 refills | Status: DC
Start: 2020-06-19 — End: 2020-09-03

## 2020-06-19 MED ORDER — CLONAZEPAM 0.5 MG PO TABS: 0.5000 mg | ORAL_TABLET | Freq: Every day | ORAL | 1 refills | Status: DC

## 2020-06-19 NOTE — Progress Notes (Signed)
[facility] [place], [place] [address]  Internal MEDICINE  Office Visit Note  Patient Name: Jasmine Barron  [id number]  [id number]  Date of Service: 06/20/2020  Chief Complaint  Patient presents with  . Follow-up    Ring around right ankle sometimes it hurts, pt had pressure headache when she went to the gym and was lifting weights, refill request  . Hyperlipidemia  . Hypertension  . 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 Has joined [employer] not been in the last few weeks due to neck pain and headache after lifting heavy weights, pain and headaches have resolved at this time History of IBS-C takes Linzess and fiber pills daily that help with constipation Aware that she does not drink enough water throughout the day Drinks on average 1-2 bottles of water per day  Has noticed that after a full day of teaching and standing on her feet some swelling in bilateral ankles  Requesting refills of clonazepam--only takes as needed at bedtime to help with sleep, takes a few times per week, does not take during the day Anxiety and depression continue to be well controlled with Celexa   Current Medication: Outpatient Encounter Medications as of 06/19/2020  Medication Sig  . citalopram (CELEXA) 40 MG tablet Take 1 tablet (40 mg total) by mouth daily.  Marland Kitchen linaclotide (LINZESS) 290 MCG CAPS capsule Take 1 capsule (290 mcg total) by mouth daily.  Marland Kitchen lisinopril (ZESTRIL) 5 MG tablet Take 1 tablet (5 mg total) by mouth daily.  . simvastatin (ZOCOR) 20 MG tablet Take 1 tablet (20 mg total) by mouth at bedtime.  . [DISCONTINUED] clonazePAM (KLONOPIN) 0.5 MG tablet Take 1 tablet (0.5 mg total) by mouth 2 (two) times daily as needed for anxiety.  . clonazePAM (KLONOPIN) 0.5 MG tablet Take 1 tablet (0.5 mg total) by mouth at bedtime.  . [DISCONTINUED]  clonazePAM (KLONOPIN) 0.5 MG tablet Take 1 tablet (0.5 mg total) by mouth at bedtime.   No facility-administered encounter medications on file as of 06/19/2020.    Surgical History: History reviewed. No pertinent surgical history.  Medical History: Past Medical History:  Diagnosis Date  . Anxiety   . High cholesterol   . Hypertension     Family History: Family History  Problem Relation Age of Onset  . Diabetes Mother   . Heart disease Mother   . Hypertension Mother   . Stroke Father   . Diabetes Brother   . Diabetes Maternal Grandmother     Social History   Socioeconomic History  . Marital status: Divorced    Spouse name: Not on file  . Number of children: Not on file  . Years of education: Not on file  . Highest education level: Not on file  Occupational History  . Not on file  Tobacco Use  . Smoking status: Never Smoker  . Smokeless tobacco: Never Used  Substance and Sexual Activity  . Alcohol use: Yes    Alcohol/week: 0.0 standard drinks  . Drug use: No  . Sexual activity: Not on file  Other Topics Concern  . Not on file  Social History Narrative  . Not on file   Social Determinants of Health   Financial Resource Strain: Not on file  Food Insecurity: Not on file  Transportation Needs: Not on file  Physical Activity: Not on file  Stress: Not  on file  Social Connections: Not on file  Intimate Partner Violence: Not on file      Review of Systems  Constitutional: Negative for chills, diaphoresis and fatigue.  HENT: Negative for ear pain, postnasal drip and sinus pressure.   Eyes: Negative for photophobia, discharge, redness, itching and visual disturbance.  Respiratory: Negative for cough, shortness of breath and wheezing.   Cardiovascular: Negative for chest pain, palpitations and leg swelling.  Gastrointestinal: Negative for abdominal pain, constipation, diarrhea, nausea and vomiting.  Genitourinary: Negative for dysuria and flank pain.   Musculoskeletal: Negative for arthralgias, back pain, gait problem and neck pain.  Skin: Negative for color change.  Allergic/Immunologic: Negative for environmental allergies and food allergies.  Neurological: Negative for dizziness and headaches.  Hematological: Does not bruise/bleed easily.  Psychiatric/Behavioral: Negative for agitation, behavioral problems (depression) and hallucinations.    Vital Signs: BP 126/86   Pulse 75   Temp (!) 97.3 F (36.3 C)   Resp 16   Ht 5\' 5"  (1.651 m)   Wt 138 lb 12.8 oz (63 kg)   SpO2 98%   BMI 23.10 kg/m    Physical Exam Vitals reviewed.  Constitutional:      Appearance: Normal appearance. She is normal weight.  Cardiovascular:     Rate and Rhythm: Normal rate and regular rhythm.     Pulses: Normal pulses.     Heart sounds: Normal heart sounds.  Pulmonary:     Effort: Pulmonary effort is normal.     Breath sounds: Normal breath sounds.  Abdominal:     General: Abdomen is flat.     Palpations: Abdomen is soft.  Musculoskeletal:        General: Normal range of motion.     Cervical back: Normal range of motion.  Skin:    General: Skin is warm.  Neurological:     General: No focal deficit present.     Mental Status: She is alert and oriented to person, place, and time. Mental status is at baseline.  Psychiatric:        Mood and Affect: Mood normal.        Behavior: Behavior normal.        Thought Content: Thought content normal.        Judgment: Judgment normal.    Assessment/Plan: 1. Essential hypertension BP and HR well controlled, continue to monitor  2. Dependent edema Discussed causes of dependent edema, no evidence of edema today or evidence of DVT Encouraged to elevate legs while in seated position and importance of wearing compression stockings daily, avoid wearing stockings at night while sleeping  3. GAD (generalized anxiety disorder) Continue with Celexa and clonazepam as needed--prescription updated to bedtime  as needed Consider alternative options if use of clonazepam becomes more frequent [place] Controlled Substance Database was reviewed by me for overdose risk score (ORS) - clonazePAM (KLONOPIN) 0.5 MG tablet; Take 1 tablet (0.5 mg total) by mouth at bedtime.  Dispense: 30 tablet; Refill: 1  General Counseling: Zyia verbalizes understanding of the findings of todays visit and agrees with plan of treatment. I have discussed any further diagnostic evaluation that may be needed or ordered today. We also reviewed her medications today. she has been encouraged to call the office with any questions or concerns that should arise related to todays visit.   Meds ordered this encounter  Medications  . DISCONTD: clonazePAM (KLONOPIN) 0.5 MG tablet    Sig: Take 1 tablet (0.5 mg total) by mouth at bedtime.  Dispense:  30 tablet    Refill:  1  . clonazePAM (KLONOPIN) 0.5 MG tablet    Sig: Take 1 tablet (0.5 mg total) by mouth at bedtime.    Dispense:  30 tablet    Refill:  1    Time spent: 30 Minutes Time spent includes review of chart, medications, test results and follow-up plan with the patient.  This patient was seen by Theodoro Grist AGNP-C in Collaboration with Dr Lavera Guise as a part of collaborative care agreement     Tanna Furry. Orian Figueira AGNP-C Internal medicine

## 2020-06-20 ENCOUNTER — Encounter: Payer: Self-pay | Admitting: Hospice and Palliative Medicine

## 2020-06-21 ENCOUNTER — Other Ambulatory Visit: Payer: Self-pay | Admitting: Nurse Practitioner

## 2020-06-21 DIAGNOSIS — I10 Essential (primary) hypertension: Secondary | ICD-10-CM

## 2020-07-11 ENCOUNTER — Other Ambulatory Visit: Payer: Self-pay | Admitting: Nurse Practitioner

## 2020-07-11 DIAGNOSIS — I10 Essential (primary) hypertension: Secondary | ICD-10-CM

## 2020-07-16 ENCOUNTER — Other Ambulatory Visit: Payer: Self-pay | Admitting: Nurse Practitioner

## 2020-07-16 DIAGNOSIS — I10 Essential (primary) hypertension: Secondary | ICD-10-CM

## 2020-08-24 ENCOUNTER — Other Ambulatory Visit: Payer: Self-pay | Admitting: Nurse Practitioner

## 2020-08-24 DIAGNOSIS — E782 Mixed hyperlipidemia: Secondary | ICD-10-CM

## 2020-08-27 ENCOUNTER — Other Ambulatory Visit: Payer: Self-pay | Admitting: Nurse Practitioner

## 2020-08-27 DIAGNOSIS — E782 Mixed hyperlipidemia: Secondary | ICD-10-CM

## 2020-08-27 DIAGNOSIS — I10 Essential (primary) hypertension: Secondary | ICD-10-CM

## 2020-08-27 DIAGNOSIS — F411 Generalized anxiety disorder: Secondary | ICD-10-CM

## 2020-09-01 ENCOUNTER — Other Ambulatory Visit: Payer: Self-pay | Admitting: Nurse Practitioner

## 2020-09-01 DIAGNOSIS — E782 Mixed hyperlipidemia: Secondary | ICD-10-CM

## 2020-09-01 DIAGNOSIS — I10 Essential (primary) hypertension: Secondary | ICD-10-CM

## 2020-09-03 ENCOUNTER — Telehealth: Payer: Self-pay

## 2020-09-03 ENCOUNTER — Other Ambulatory Visit: Payer: Self-pay | Admitting: Internal Medicine

## 2020-09-03 DIAGNOSIS — F411 Generalized anxiety disorder: Secondary | ICD-10-CM

## 2020-09-03 MED ORDER — CLONAZEPAM 0.5 MG PO TABS: 0.5000 mg | ORAL_TABLET | Freq: Every day | ORAL | 1 refills | Status: DC

## 2020-09-03 NOTE — Telephone Encounter (Signed)
Pt advised we send medicine until her appt

## 2020-09-03 NOTE — Telephone Encounter (Signed)
Done

## 2020-10-20 ENCOUNTER — Other Ambulatory Visit: Payer: Self-pay

## 2020-10-20 ENCOUNTER — Ambulatory Visit (INDEPENDENT_AMBULATORY_CARE_PROVIDER_SITE_OTHER): Payer: Medicare PPO | Admitting: Nurse Practitioner

## 2020-10-20 ENCOUNTER — Encounter: Payer: Self-pay | Admitting: Nurse Practitioner

## 2020-10-20 VITALS — BP 108/86 | HR 69 | Temp 98.5°F | Resp 16 | Ht 65.0 in | Wt 144.8 lb

## 2020-10-20 DIAGNOSIS — E782 Mixed hyperlipidemia: Secondary | ICD-10-CM | POA: Diagnosis not present

## 2020-10-20 DIAGNOSIS — F321 Major depressive disorder, single episode, moderate: Secondary | ICD-10-CM | POA: Diagnosis not present

## 2020-10-20 DIAGNOSIS — I10 Essential (primary) hypertension: Secondary | ICD-10-CM

## 2020-10-20 DIAGNOSIS — Z79899 Other long term (current) drug therapy: Secondary | ICD-10-CM | POA: Diagnosis not present

## 2020-10-20 DIAGNOSIS — G4700 Insomnia, unspecified: Secondary | ICD-10-CM

## 2020-10-20 DIAGNOSIS — R609 Edema, unspecified: Secondary | ICD-10-CM | POA: Diagnosis not present

## 2020-10-20 DIAGNOSIS — F411 Generalized anxiety disorder: Secondary | ICD-10-CM

## 2020-10-20 MED ORDER — ZOSTER VAC RECOMB ADJUVANTED 50 MCG/0.5ML IM SUSR: 0.5000 mL | Freq: Once | INTRAMUSCULAR | 0 refills | Status: AC

## 2020-10-20 MED ORDER — MIRTAZAPINE 15 MG PO TABS: 15.0000 mg | ORAL_TABLET | Freq: Every day | ORAL | 0 refills | Status: DC

## 2020-10-20 NOTE — Progress Notes (Signed)
[facility] [place], [place] [address]  Internal MEDICINE  Office Visit Note  Patient Name: Jasmine Barron  [id number]  [id number]  Date of Service: 10/20/2020  Chief Complaint  Patient presents with   Follow-up   Hypertension    HPI Jasmine Barron presents for a follow up visit for hypertension, depression, anxiety, and insomnia. She is not currently taking lisinopril or simvastatin. She takes citalopram for anxiety and depression and clonazepam for insomnia.  Jasmine Barron has mixed insomnia but is having more issues with sleep maintenance at this time. Jasmine Barron has been taking clonazepam 0.5 at bedtime to help her sleep. This is the only medication she has tried for sleep. Prior documentation lists clonazepam as a medication being prescribed for anxiety but the patient states that she takes it for insomnia. It is not a recommended first-line treatment for insomnia and long-term use is concerning for addiction, tolerance and dependence. She is at max dose of citalopram ('40mg'$  daily) and has been taking this medication at bedtime. Citalopram can cause insomnia as a side effect that is dose-related. She also c/o being drowsy during the day which may be due to the extended half-life of clonazepam. She is willing to try other medications to help her sleep. Discussed with patient that there are several medication options for treating insomnia. -She has been on lisinopril 5 mg daily for hypertension for several years. Her blood pressure is 108/86 today. She stopped taking her lisinopril over a month ago. Per EHR documentation of blood pressures between today and 2019, her blood pressure has not been significantly elevated. The patient would like to remain off blood pressure medication if possible.  -Jasmine Barron stopped taking her simvastatin over 1 month ago. Her last lipid panel was drawn in July 2021 and all values were normal except for LDL of 101. Patient would like to stay off statin therapy if possible. She  will need her lipid panel rechecked, patient may stay off statin therapy until her lipid panel is drawn and evaluated.     Current Medication: Outpatient Encounter Medications as of 10/20/2020  Medication Sig   citalopram (CELEXA) 40 MG tablet Take 1 tablet (40 mg total) by mouth daily.   mirtazapine (REMERON) 15 MG tablet Take 1 tablet (15 mg total) by mouth at bedtime.   simvastatin (ZOCOR) 20 MG tablet Take 1 tablet (20 mg total) by mouth at bedtime.   [DISCONTINUED] clonazePAM (KLONOPIN) 0.5 MG tablet Take 1 tablet (0.5 mg total) by mouth at bedtime.   [DISCONTINUED] lisinopril (ZESTRIL) 5 MG tablet Take 1 tablet (5 mg total) by mouth daily.   [DISCONTINUED] Zoster Vaccine Adjuvanted [facility]) injection Inject 0.5 mLs into the muscle once.   linaclotide (LINZESS) 290 MCG CAPS capsule Take 1 capsule (290 mcg total) by mouth daily. (Patient not taking: Reported on 10/20/2020)   No facility-administered encounter medications on file as of 10/20/2020.    Surgical History: History reviewed. No pertinent surgical history.  Medical History: Past Medical History:  Diagnosis Date   Anxiety    High cholesterol    Hypertension     Family History: Family History  Problem Relation Age of Onset   Diabetes Mother    Heart disease Mother    Hypertension Mother    Stroke Father    Diabetes Brother    Diabetes Maternal Grandmother     Social History   Socioeconomic History   Marital status: Divorced    Spouse name: Not on file   Number of  children: Not on file   Years of education: Not on file   Highest education level: Not on file  Occupational History   Not on file  Tobacco Use   Smoking status: Never   Smokeless tobacco: Never  Substance and Sexual Activity   Alcohol use: Yes    Alcohol/week: 0.0 standard drinks   Drug use: No   Sexual activity: Not on file  Other Topics Concern   Not on file  Social History Narrative   Not on file   Social Determinants of Health    Financial Resource Strain: Not on file  Food Insecurity: Not on file  Transportation Needs: Not on file  Physical Activity: Not on file  Stress: Not on file  Social Connections: Not on file  Intimate Partner Violence: Not on file      Review of Systems  Constitutional:  Positive for activity change and fatigue.  HENT: Negative.    Respiratory: Negative.  Negative for cough, chest tightness, shortness of breath and wheezing.   Cardiovascular: Negative.  Negative for chest pain and palpitations.  Gastrointestinal: Negative.   Genitourinary: Negative.   Musculoskeletal: Negative.   Psychiatric/Behavioral:  Positive for behavioral problems and sleep disturbance. The patient is nervous/anxious.    Vital Signs: BP 108/86   Pulse 69   Temp 98.5 F (36.9 C)   Resp 16   Ht '5\' 5"'$  (1.651 m)   Wt 144 lb 12.8 oz (65.7 kg)   SpO2 96%   BMI 24.10 kg/m    Physical Exam Vitals reviewed.  Constitutional:      General: She is not in acute distress.    Appearance: Normal appearance. She is normal weight. She is not ill-appearing.  HENT:     Head: Normocephalic and atraumatic.  Cardiovascular:     Rate and Rhythm: Normal rate and regular rhythm.     Pulses: Normal pulses.     Heart sounds: Normal heart sounds. No murmur heard. Pulmonary:     Effort: Pulmonary effort is normal. No respiratory distress.     Breath sounds: Normal breath sounds. No wheezing.  Skin:    General: Skin is warm and dry.     Capillary Refill: Capillary refill takes less than 2 seconds.  Neurological:     Mental Status: She is alert and oriented to person, place, and time.  Psychiatric:        Mood and Affect: Mood normal.        Behavior: Behavior normal.     Assessment/Plan: 1. Mixed insomnia Clonazepam discontinued, start mirtazapine tonight, take medication at bedtime. Do not take citalopram tonight, start taking citalopram in the morning tomorrow to see if this also improves insomnia. Patient  still has some clonazepam left over, patient instructed to keep the left over medication just in case the new medication does not help. Follow up in 4 weeks to evaluate for improvement of sleep. - mirtazapine (REMERON) 15 MG tablet; Take 1 tablet (15 mg total) by mouth at bedtime.  Dispense: 30 tablet; Refill: 0  2. Moderate major depression (HCC) Continue to take citalopram as prescribed except take in the morning instead of at bedtime. If taking citalopram in the morning makes her feel more drowsy, she was instructed to go back to taking it at night.  Mirtazapine was added to help the patient sleep. Celexa and mirtazapine are both antidepressants and the new medication may help further minimize depressive symptoms.  3. GAD (generalized anxiety disorder) Patient is taking citalopram for  depression and anxiety.   4. Essential hypertension Lisinopril discontinued. Will monitor for a period of time. Patient encouraged to check her blood pressure at home a couple of times a week, will reassess in 4 weeks. Pharmacologic intervention not necessary at this time.   5. Mixed hyperlipidemia Hold simvastatin, recheck lipid panel to determine if statin therapy is necessary.  - Lipid Profile  6. Dependent edema Resolved per patient.   7. Encounter for long-term (current) use of high-risk medication Uds done to monitor for compliance with prescribed benzodiazepine.  - POCT Urine drug screen   General Counseling: Rhesa verbalizes understanding of the findings of todays visit and agrees with plan of treatment. I have discussed any further diagnostic evaluation that may be needed or ordered today. We also reviewed her medications today. she has been encouraged to call the office with any questions or concerns that should arise related to todays visit.    Orders Placed This Encounter  Procedures   Lipid Profile   POCT Urine drug screen    Meds ordered this encounter  Medications   mirtazapine  (REMERON) 15 MG tablet    Sig: Take 1 tablet (15 mg total) by mouth at bedtime.    Dispense:  30 tablet    Refill:  0    Return in about 4 weeks (around 11/17/2020) for F/U insomnia and depression, BP check, Rmani Kellogg PCP.   Total time spent:30 Minutes Time spent includes review of chart, medications, test results, and follow up plan with the patient.   [place] Controlled Substance Database was reviewed by me.  This patient was seen by Jonetta Osgood, FNP-C in collaboration with Dr. Clayborn Bigness as a part of collaborative care agreement.   Modupe Shampine R. Valetta Fuller, MSN, FNP-C Internal medicine

## 2020-10-21 LAB — POCT URINE DRUG SCREEN
POC Amphetamine UR: NOT DETECTED
POC BENZODIAZEPINES UR: POSITIVE — AB
POC Barbiturate UR: NOT DETECTED
POC Cocaine UR: NOT DETECTED
POC Ecstasy UR: NOT DETECTED
POC Marijuana UR: NOT DETECTED
POC Methadone UR: NOT DETECTED
POC Methamphetamine UR: NOT DETECTED
POC Opiate Ur: NOT DETECTED
POC Oxycodone UR: NOT DETECTED
POC PHENCYCLIDINE UR: NOT DETECTED
POC TRICYCLICS UR: NOT DETECTED

## 2020-11-24 ENCOUNTER — Ambulatory Visit (INDEPENDENT_AMBULATORY_CARE_PROVIDER_SITE_OTHER): Payer: Medicare PPO | Admitting: Internal Medicine

## 2020-11-24 ENCOUNTER — Encounter: Payer: Self-pay | Admitting: Internal Medicine

## 2020-11-24 ENCOUNTER — Telehealth: Payer: Self-pay

## 2020-11-24 ENCOUNTER — Other Ambulatory Visit: Payer: Self-pay

## 2020-11-24 VITALS — BP 140/88 | HR 83 | Temp 98.1°F | Resp 16 | Ht 65.0 in | Wt 146.8 lb

## 2020-11-24 DIAGNOSIS — K219 Gastro-esophageal reflux disease without esophagitis: Secondary | ICD-10-CM | POA: Diagnosis not present

## 2020-11-24 DIAGNOSIS — Z23 Encounter for immunization: Secondary | ICD-10-CM

## 2020-11-24 DIAGNOSIS — E2839 Other primary ovarian failure: Secondary | ICD-10-CM

## 2020-11-24 DIAGNOSIS — Z1239 Encounter for other screening for malignant neoplasm of breast: Secondary | ICD-10-CM | POA: Diagnosis not present

## 2020-11-24 DIAGNOSIS — G4709 Other insomnia: Secondary | ICD-10-CM | POA: Diagnosis not present

## 2020-11-24 MED ORDER — PNEUMOCOCCAL 20-VAL CONJ VACC 0.5 ML IM SUSY: 0.5000 mL | PREFILLED_SYRINGE | INTRAMUSCULAR | 0 refills | Status: AC

## 2020-11-24 MED ORDER — SHINGRIX 50 MCG/0.5ML IM SUSR
0.5000 mL | Freq: Once | INTRAMUSCULAR | 0 refills | Status: AC
Start: 2020-11-24 — End: 2020-11-24

## 2020-11-24 MED ORDER — CLONAZEPAM 0.5 MG PO TABS: ORAL_TABLET | ORAL | 2 refills | Status: DC

## 2020-11-24 NOTE — Telephone Encounter (Signed)
Mammogram & dexa order faxed to [place]

## 2020-11-24 NOTE — Progress Notes (Signed)
[facility] [place], [place] [address]  Internal MEDICINE  Office Visit Note  Patient Name: Jasmine Barron  [id number]  [id number]  Date of Service: 11/29/2020  Chief Complaint  Patient presents with   Medication Management    1 month follow up on Remeron pt states she didn't do well.  Jitters, dropping things, taste change, horrible dreams, fatigue, rapid heartbeat.  Stopped med at day 6    HPI  Pt is here for routine follow up Remeron did not help her, she was on Klonopin and has done well, Remeron is not helping her.  2.   Pt is due for Colonoscopy and BMD  3.  Has retired as a Education officer, museum. 4. Continues to take all her medication   Current Medication: Outpatient Encounter Medications as of 11/24/2020  Medication Sig   calcium citrate-vitamin D (CITRACAL+D) 315-200 MG-UNIT tablet Take 1 tablet by mouth 2 (two) times daily.   citalopram (CELEXA) 40 MG tablet Take 1 tablet (40 mg total) by mouth daily.   clonazePAM (KLONOPIN) 0.5 MG tablet Take one tab po qhs for anxiety and insomnia   METAMUCIL FIBER PO Take by mouth.   simvastatin (ZOCOR) 20 MG tablet Take 1 tablet (20 mg total) by mouth at bedtime.   [DISCONTINUED] pneumococcal 20-Val Conj Vacc (PREVNAR 20) 0.5 ML injection Inject 0.5 mLs into the muscle tomorrow at 10 am.   [DISCONTINUED] Zoster Vaccine Adjuvanted [facility]) injection Inject 0.5 mLs into the muscle once.   [EXPIRED] pneumococcal 20-Val Conj Vacc (PREVNAR 20) 0.5 ML injection Inject 0.5 mLs into the muscle tomorrow at 10 am for 1 dose.   [EXPIRED] Zoster Vaccine Adjuvanted [facility]) injection Inject 0.5 mLs into the muscle once for 1 dose.   [DISCONTINUED] linaclotide (LINZESS) 290 MCG CAPS capsule Take 1 capsule (290 mcg total) by mouth daily. (Patient not taking: No sig reported)   [DISCONTINUED] mirtazapine (REMERON) 15 MG tablet Take 1 tablet (15 mg total) by mouth at bedtime. (Patient not taking: Reported on 11/24/2020)   No  facility-administered encounter medications on file as of 11/24/2020.    Surgical History: Past Surgical History:  Procedure Laterality Date   TONSILLECTOMY Bilateral     Medical History: Past Medical History:  Diagnosis Date   Anxiety    High cholesterol    Hypertension     Family History: Family History  Problem Relation Age of Onset   Diabetes Mother    Heart disease Mother    Hypertension Mother    Stroke Father    Diabetes Brother    Diabetes Maternal Grandmother     Social History   Socioeconomic History   Marital status: Divorced    Spouse name: Not on file   Number of children: Not on file   Years of education: Not on file   Highest education level: Not on file  Occupational History   Not on file  Tobacco Use   Smoking status: Never   Smokeless tobacco: Never  Substance and Sexual Activity   Alcohol use: Yes    Comment: 2-3 times a week wine and rum   Drug use: No   Sexual activity: Not on file  Other Topics Concern   Not on file  Social History Narrative   Not on file   Social Determinants of Health   Financial Resource Strain: Not on file  Food Insecurity: Not on file  Transportation Needs: Not on file  Physical Activity: Not on file  Stress: Not on  file  Social Connections: Not on file  Intimate Partner Violence: Not on file      Review of Systems  Constitutional:  Negative for chills, fatigue and unexpected weight change.  HENT:  Negative for congestion, postnasal drip, rhinorrhea, sneezing and sore throat.   Eyes:  Negative for redness.  Respiratory:  Negative for cough, chest tightness and shortness of breath.   Cardiovascular:  Negative for chest pain and palpitations.  Gastrointestinal:  Negative for abdominal pain, constipation, diarrhea, nausea and vomiting.  Genitourinary:  Negative for dysuria and frequency.  Musculoskeletal:  Negative for arthralgias, back pain, joint swelling and neck pain.  Skin:  Negative for rash.   Neurological: Negative.  Negative for tremors and numbness.  Hematological:  Negative for adenopathy. Does not bruise/bleed easily.  Psychiatric/Behavioral:  Negative for behavioral problems (Depression), sleep disturbance and suicidal ideas. The patient is not nervous/anxious.    Vital Signs: BP 140/88   Pulse 83   Temp 98.1 F (36.7 C)   Resp 16   Ht '5\' 5"'$  (1.651 m)   Wt 146 lb 12.8 oz (66.6 kg)   SpO2 98%   BMI 24.43 kg/m    Physical Exam Constitutional:      Appearance: Normal appearance.  HENT:     Head: Normocephalic and atraumatic.     Nose: Nose normal.     Mouth/Throat:     Mouth: Mucous membranes are moist.     Pharynx: No posterior oropharyngeal erythema.  Eyes:     Extraocular Movements: Extraocular movements intact.     Pupils: Pupils are equal, round, and reactive to light.  Cardiovascular:     Pulses: Normal pulses.     Heart sounds: Normal heart sounds.  Pulmonary:     Effort: Pulmonary effort is normal.     Breath sounds: Normal breath sounds.  Neurological:     General: No focal deficit present.     Mental Status: She is alert.  Psychiatric:        Mood and Affect: Mood normal.        Behavior: Behavior normal.       Assessment/Plan: 1. Other insomnia Stop Remeron due to side effects // intolerance  - clonazePAM (KLONOPIN) 0.5 MG tablet; Take one tab po qhs for anxiety and insomnia  Dispense: 30 tablet; Refill: 2  2. GERD without esophagitis Might NEED  - Ambulatory referral to Gastroenterology  3. Other primary ovarian failure Needs to be updated  - DG Bone Density; Future  4. Screening breast examination - MM DIGITAL SCREENING BILATERAL; Future  5. Need for shingles vaccine - Zoster Vaccine Adjuvanted [facility]) injection; Inject 0.5 mLs into the muscle once for 1 dose.  Dispense: 0.5 mL; Refill: 0  6. Need for Streptococcus pneumoniae vaccination - pneumococcal 20-Val Conj Vacc (PREVNAR 20) 0.5 ML injection; Inject 0.5 mLs into  the muscle tomorrow at 10 am for 1 dose.  Dispense: 0.5 mL; Refill: 0   General Counseling: Jasmine Barron verbalizes understanding of the findings of todays visit and agrees with plan of treatment. I have discussed any further diagnostic evaluation that may be needed or ordered today. We also reviewed her medications today. she has been encouraged to call the office with any questions or concerns that should arise related to todays visit.    Orders Placed This Encounter  Procedures   MM DIGITAL SCREENING BILATERAL   DG Bone Density   Ambulatory referral to Gastroenterology    Meds ordered this encounter  Medications  pneumococcal 20-Val Conj Vacc (PREVNAR 20) 0.5 ML injection    Sig: Inject 0.5 mLs into the muscle tomorrow at 10 am for 1 dose.    Dispense:  0.5 mL    Refill:  0   Zoster Vaccine Adjuvanted [facility]) injection    Sig: Inject 0.5 mLs into the muscle once for 1 dose.    Dispense:  0.5 mL    Refill:  0   clonazePAM (KLONOPIN) 0.5 MG tablet    Sig: Take one tab po qhs for anxiety and insomnia    Dispense:  30 tablet    Refill:  2    Total time spent:35 Minutes Time spent includes review of chart, medications, test results, and follow up plan with the patient.   [place] Controlled Substance Database was reviewed by me.   Dr Lavera Guise Internal medicine

## 2020-11-29 ENCOUNTER — Encounter: Payer: Self-pay | Admitting: Internal Medicine

## 2020-12-12 DIAGNOSIS — Z1231 Encounter for screening mammogram for malignant neoplasm of breast: Secondary | ICD-10-CM | POA: Diagnosis not present

## 2020-12-12 DIAGNOSIS — M8588 Other specified disorders of bone density and structure, other site: Secondary | ICD-10-CM | POA: Diagnosis not present

## 2020-12-12 DIAGNOSIS — E2839 Other primary ovarian failure: Secondary | ICD-10-CM | POA: Diagnosis not present

## 2021-01-04 ENCOUNTER — Other Ambulatory Visit: Payer: Self-pay | Admitting: Internal Medicine

## 2021-01-04 DIAGNOSIS — F321 Major depressive disorder, single episode, moderate: Secondary | ICD-10-CM

## 2021-01-22 ENCOUNTER — Ambulatory Visit: Payer: Medicare PPO | Admitting: Gastroenterology

## 2021-01-22 ENCOUNTER — Encounter: Payer: Self-pay | Admitting: Gastroenterology

## 2021-01-22 ENCOUNTER — Other Ambulatory Visit: Payer: Self-pay

## 2021-01-22 VITALS — BP 154/87 | HR 84 | Temp 98.2°F | Ht 65.0 in | Wt 148.0 lb

## 2021-01-22 DIAGNOSIS — R1319 Other dysphagia: Secondary | ICD-10-CM | POA: Diagnosis not present

## 2021-01-22 DIAGNOSIS — K219 Gastro-esophageal reflux disease without esophagitis: Secondary | ICD-10-CM

## 2021-01-22 DIAGNOSIS — Z1211 Encounter for screening for malignant neoplasm of colon: Secondary | ICD-10-CM

## 2021-01-22 DIAGNOSIS — K5909 Other constipation: Secondary | ICD-10-CM | POA: Diagnosis not present

## 2021-01-22 MED ORDER — NA SULFATE-K SULFATE-MG SULF 17.5-3.13-1.6 GM/177ML PO SOLN: 354.0000 mL | Freq: Once | ORAL | 0 refills | Status: AC

## 2021-01-22 NOTE — Patient Instructions (Addendum)
Example of a wedge pillow:    Food Choices for Gastroesophageal Reflux Disease, Adult When you have gastroesophageal reflux disease (GERD), the foods you eat and your eating habits are very important. Choosing the right foods can help ease your discomfort. Think about working with a food expert (dietitian) to help you make good choices. What are tips for following this plan? Reading food labels Look for foods that are low in saturated fat. Foods that may help with your symptoms include: Foods that have less than 5% of daily value (DV) of fat. Foods that have 0 grams of trans fat. Cooking Do not fry your food. Cook your food by baking, steaming, grilling, or broiling. These are all methods that do not need a lot of fat for cooking. To add flavor, try to use herbs that are low in spice and acidity. Meal planning  Choose healthy foods that are low in fat, such as: Fruits and vegetables. Whole grains. Low-fat dairy products. Lean meats, fish, and poultry. Eat small meals often instead of eating 3 large meals each day. Eat your meals slowly in a place where you are relaxed. Avoid bending over or lying down until 2-3 hours after eating. Limit high-fat foods such as fatty meats or fried foods. Limit your intake of fatty foods, such as oils, butter, and shortening. Avoid the following as told by your doctor: Foods that cause symptoms. These may be different for different people. Keep a food diary to keep track of foods that cause symptoms. Alcohol. Drinking a lot of liquid with meals. Eating meals during the 2-3 hours before bed. Lifestyle Stay at a healthy weight. Ask your doctor what weight is healthy for you. If you need to lose weight, work with your doctor to do so safely. Exercise for at least 30 minutes on 5 or more days each week, or as told by your doctor. Wear loose-fitting clothes. Do not smoke or use any products that contain nicotine or tobacco. If you need help quitting, ask  your doctor. Sleep with the head of your bed higher than your feet. Use a wedge under the mattress or blocks under the bed frame to raise the head of the bed. Chew sugar-free gum after meals. What foods should eat? Eat a healthy, well-balanced diet of fruits, vegetables, whole grains, low-fat dairy products, lean meats, fish, and poultry. Each person is different. Foods that may cause symptoms in one person may not cause any symptoms in another person. Work with your doctor to find foods that are safe for you. The items listed above may not be a complete list of what you can eat and drink. Contact a food expert for more options. What foods should I avoid? Limiting some of these foods may help in managing the symptoms of GERD. Everyone is different. Talk with a food expert or your doctor to help you find the exact foods to avoid, if any. Fruits Any fruits prepared with added fat. Any fruits that cause symptoms. For some people, this may include citrus fruits, such as oranges, grapefruit, pineapple, and lemons. Vegetables Deep-fried vegetables. [place] fries. Any vegetables prepared with added fat. Any vegetables that cause symptoms. For some people, this may include tomatoes and tomato products, chili peppers, onions and garlic, and horseradish. Grains Pastries or quick breads with added fat. Meats and other proteins High-fat meats, such as fatty beef or pork, hot dogs, ribs, ham, sausage, salami, and bacon. Fried meat or protein, including fried fish and fried chicken. Nuts and  nut butters, in large amounts. Dairy Whole milk and chocolate milk. Sour cream. Cream. Ice cream. Cream cheese. Milkshakes. Fats and oils Butter. Margarine. Shortening. Ghee. Beverages Coffee and tea, with or without caffeine. Carbonated beverages. Sodas. Energy drinks. Fruit juice made with acidic fruits, such as orange or grapefruit. Tomato juice. Alcoholic drinks. Sweets and desserts Chocolate and cocoa.  Donuts. Seasonings and condiments Pepper. Peppermint and spearmint. Added salt. Any condiments, herbs, or seasonings that cause symptoms. For some people, this may include curry, hot sauce, or vinegar-based salad dressings. The items listed above may not be a complete list of what you should not eat and drink. Contact a food expert for more options. Questions to ask your doctor Diet and lifestyle changes are often the first steps that are taken to manage symptoms of GERD. If diet and lifestyle changes do not help, talk with your doctor about taking medicines. Where to find more information International Foundation for Gastrointestinal Disorders: aboutgerd.org Summary When you have GERD, food and lifestyle choices are very important in easing your symptoms. Eat small meals often instead of 3 large meals a day. Eat your meals slowly and in a place where you are relaxed. Avoid bending over or lying down until 2-3 hours after eating. Limit high-fat foods such as fatty meats or fried foods. This information is not intended to replace advice given to you by your health care provider. Make sure you discuss any questions you have with your health care provider. Document Revised: 09/24/2019 Document Reviewed: 09/24/2019 Elsevier Patient Education  [place].

## 2021-01-22 NOTE — Progress Notes (Signed)
Jonathon Bellows MD, MRCP(U.K) [address]  [place]  [place], [place] [address]  Main: [phone]  Fax: [phone]   Gastroenterology Consultation  Referring Provider:     Lavera Guise, MD Primary Care Physician:  Lavera Guise, MD Primary Gastroenterologist:  Dr. Jonathon Bellows  Reason for Consultation:     GERD and colon cancer screening         HPI:   Jasmine Barron is a 65 y.o. y/o female referred for consultation & management  by Dr. Humphrey Rolls, Timoteo Gaul, MD.   She is here to see me for acid reflux which she has had over 15 years.  She takes Prilosec 20 mg of 40 mg which she is not too sure as needed.  Does not have complete resolution of symptoms wakes up at times at night with heartburn and chest discomfort and sometimes wakes up in the morning with discomfort persisting all day long.  She has lost weight changes the way she eats which is helped to certain degree.  She is also noted since summer that she has had difficulty swallowing solids at times and liquids at times feels like it gets stuck in the center of her chest.  She is also due for a colonoscopy which she has last had over 15 years back.  No family or personal history of colon polyps or cancers.  No lower GI symptoms such as change in bowel habits or rectal bleeding or change in the caliber of her stool.  She suffers from constipation which is longstanding and she has tried Actuary which is failed   Past Medical History:  Diagnosis Date   Anxiety    High cholesterol    Hypertension     Past Surgical History:  Procedure Laterality Date   TONSILLECTOMY Bilateral     Prior to Admission medications   Medication Sig Start Date End Date Taking? Authorizing Provider  calcium citrate-vitamin D (CITRACAL+D) 315-200 MG-UNIT tablet Take 1 tablet by mouth 2 (two) times daily.    [provider]  citalopram (CELEXA) 40 MG tablet Take 1 tablet by mouth once daily 01/04/21   Lavera Guise, MD  clonazePAM Bobbye [place]) 0.5  MG tablet Take one tab po qhs for anxiety and insomnia 11/24/20   Lavera Guise, MD  METAMUCIL FIBER PO Take by mouth.    [provider]  simvastatin (ZOCOR) 20 MG tablet Take 1 tablet (20 mg total) by mouth at bedtime. 06/18/19   Ronnell Freshwater, NP    Family History  Problem Relation Age of Onset   Diabetes Mother    Heart disease Mother    Hypertension Mother    Stroke Father    Diabetes Brother    Diabetes Maternal Grandmother      Social History   Tobacco Use   Smoking status: Never   Smokeless tobacco: Never  Substance Use Topics   Alcohol use: Yes    Comment: 2-3 times a week wine and rum   Drug use: No    Allergies as of 01/22/2021 - Review Complete 01/22/2021  Allergen Reaction Noted   Amoxicillin Hives 01/22/2021   No known allergies  01/22/2021    Review of Systems:    All systems reviewed and negative except where noted in HPI.   Physical Exam:  BP (!) 154/87   Pulse 84   Temp 98.2 F (36.8 C) (Oral)   Ht 5\' 5"  (1.651 m)   Wt 148 lb (67.1  kg)   BMI 24.63 kg/m  No LMP recorded. Patient is postmenopausal. Psych:  Alert and cooperative. Normal mood and affect. General:   Alert,  Well-developed, well-nourished, pleasant and cooperative in NAD Head:  Normocephalic and atraumatic. Eyes:  Sclera clear, no icterus.   Conjunctiva pink. Ears:  Normal auditory acuity.   Neurologic:  Alert and oriented x3;  grossly normal neurologically. Psych:  Alert and cooperative. Normal mood and affect.  Imaging Studies: No results found.  Assessment and Plan:   Jasmine Barron is a 65 y.o. y/o female has been referred for GERD.Marland Kitchen  Longstanding for over 15 years.  Never had screening for Barrett's esophagus.  Recent history of dysphagia.  She has not been taking the PPI on a regular basis.  Explained to her that PPI needs to be taken on a daily basis.  In addition has some constipation.  Not tried any Linzess.  We will see.  Amitiza has not helped.  Due for  screening colonoscopy average risk.  Plan 1.  EGD to evaluate for dysphagia and colonoscopy for colon cancer screening average risk. 2.  Commence on Prilosec 40 mg on a daily basis fasting in the morning on empty stomach. 3.  Commence on Linzess 290 mcg daily for 1 week samples have been provided.  If it works she will call for prescription. 4.  GERD patient information provided counseled on lifestyle changes.  With picture of a wedge pillow provided for her to try  I have discussed alternative options, risks & benefits,  which include, but are not limited to, bleeding, infection, perforation,respiratory complication & drug reaction.  The patient agrees with this plan & written consent will be obtained.     Follow up in 8 to 12 weeks  Dr Jonathon Bellows MD,MRCP(U.K)

## 2021-02-04 ENCOUNTER — Ambulatory Visit
Admission: RE | Admit: 2021-02-04 | Discharge: 2021-02-04 | Disposition: A | Discharge status: Home / Self Care | Payer: Medicare PPO | Source: Ambulatory Visit | Attending: Gastroenterology | Admitting: Gastroenterology

## 2021-02-04 ENCOUNTER — Encounter
Admission: RE | Disposition: A | Discharge status: Home / Self Care | Payer: Self-pay | Source: Ambulatory Visit | Attending: Gastroenterology

## 2021-02-04 ENCOUNTER — Ambulatory Visit: Payer: Medicare PPO | Admitting: Anesthesiology

## 2021-02-04 ENCOUNTER — Encounter: Payer: Self-pay | Admitting: Gastroenterology

## 2021-02-04 DIAGNOSIS — D126 Benign neoplasm of colon, unspecified: Secondary | ICD-10-CM

## 2021-02-04 DIAGNOSIS — I10 Essential (primary) hypertension: Secondary | ICD-10-CM | POA: Insufficient documentation

## 2021-02-04 DIAGNOSIS — K219 Gastro-esophageal reflux disease without esophagitis: Secondary | ICD-10-CM

## 2021-02-04 DIAGNOSIS — K2100 Gastro-esophageal reflux disease with esophagitis, without bleeding: Secondary | ICD-10-CM | POA: Insufficient documentation

## 2021-02-04 DIAGNOSIS — Z79899 Other long term (current) drug therapy: Secondary | ICD-10-CM | POA: Diagnosis not present

## 2021-02-04 DIAGNOSIS — K449 Diaphragmatic hernia without obstruction or gangrene: Secondary | ICD-10-CM | POA: Insufficient documentation

## 2021-02-04 DIAGNOSIS — D120 Benign neoplasm of cecum: Secondary | ICD-10-CM | POA: Diagnosis not present

## 2021-02-04 DIAGNOSIS — Z1211 Encounter for screening for malignant neoplasm of colon: Secondary | ICD-10-CM

## 2021-02-04 DIAGNOSIS — R1319 Other dysphagia: Secondary | ICD-10-CM

## 2021-02-04 DIAGNOSIS — E782 Mixed hyperlipidemia: Secondary | ICD-10-CM | POA: Diagnosis not present

## 2021-02-04 DIAGNOSIS — K635 Polyp of colon: Secondary | ICD-10-CM | POA: Diagnosis not present

## 2021-02-04 DIAGNOSIS — R1310 Dysphagia, unspecified: Secondary | ICD-10-CM | POA: Insufficient documentation

## 2021-02-04 HISTORY — PX: ESOPHAGOGASTRODUODENOSCOPY: SHX5428

## 2021-02-04 HISTORY — PX: COLONOSCOPY WITH PROPOFOL: SHX5780

## 2021-02-04 SURGERY — COLONOSCOPY WITH PROPOFOL
Anesthesia: General

## 2021-02-04 MED ORDER — FENTANYL CITRATE (PF) 100 MCG/2ML IJ SOLN
INTRAMUSCULAR | Status: AC
  Filled 2021-02-04: qty 2

## 2021-02-04 MED ORDER — EPHEDRINE SULFATE 50 MG/ML IJ SOLN
INTRAMUSCULAR | Status: DC | PRN
  Administered 2021-02-04: 10 mg via INTRAVENOUS

## 2021-02-04 MED ORDER — SODIUM CHLORIDE 0.9 % IV SOLN: INTRAVENOUS | Status: DC

## 2021-02-04 MED ORDER — PROPOFOL 10 MG/ML IV BOLUS
INTRAVENOUS | Status: DC | PRN
  Administered 2021-02-04: 10 mg via INTRAVENOUS
  Administered 2021-02-04 (×2): 20 mg via INTRAVENOUS

## 2021-02-04 MED ORDER — MIDAZOLAM HCL 2 MG/2ML IJ SOLN
INTRAMUSCULAR | Status: AC
  Filled 2021-02-04: qty 2

## 2021-02-04 MED ORDER — MIDAZOLAM HCL 2 MG/2ML IJ SOLN
INTRAMUSCULAR | Status: DC | PRN
  Administered 2021-02-04: 2 mg via INTRAVENOUS

## 2021-02-04 MED ORDER — FENTANYL CITRATE (PF) 100 MCG/2ML IJ SOLN
INTRAMUSCULAR | Status: DC | PRN
  Administered 2021-02-04 (×4): 25 ug via INTRAVENOUS

## 2021-02-04 MED ORDER — LIDOCAINE HCL (CARDIAC) PF 100 MG/5ML IV SOSY
PREFILLED_SYRINGE | INTRAVENOUS | Status: DC | PRN
  Administered 2021-02-04: 50 mg via INTRAVENOUS

## 2021-02-04 MED ORDER — SIMETHICONE 40 MG/0.6ML PO SUSP
ORAL | Status: DC | PRN
  Administered 2021-02-04: 40 mg

## 2021-02-04 MED ORDER — PROPOFOL 500 MG/50ML IV EMUL
INTRAVENOUS | Status: AC
  Filled 2021-02-04: qty 50

## 2021-02-04 MED ORDER — PROPOFOL 500 MG/50ML IV EMUL
INTRAVENOUS | Status: DC | PRN
  Administered 2021-02-04: 50 ug/kg/min via INTRAVENOUS

## 2021-02-04 MED ORDER — EPHEDRINE 5 MG/ML INJ
INTRAVENOUS | Status: AC
  Filled 2021-02-04: qty 5

## 2021-02-04 NOTE — Anesthesia Postprocedure Evaluation (Signed)
Anesthesia Post Note  Patient: Jasmine Barron  Procedure(s) Performed: COLONOSCOPY WITH PROPOFOL ESOPHAGOGASTRODUODENOSCOPY (EGD)  Patient location during evaluation: PACU Anesthesia Type: General Level of consciousness: awake and alert Pain management: pain level controlled Vital Signs Assessment: post-procedure vital signs reviewed and stable Respiratory status: spontaneous breathing, nonlabored ventilation, respiratory function stable and patient connected to nasal cannula oxygen Cardiovascular status: blood pressure returned to baseline and stable Postop Assessment: no apparent nausea or vomiting Anesthetic complications: no   No notable events documented.   Last Vitals:  Vitals:   02/04/21 1416 02/04/21 1426  BP: (!) 110/96 133/77  Pulse: 73 70  Resp: 15 19  Temp:    SpO2: 99% 100%    Last Pain:  Vitals:   02/04/21 1426  TempSrc:   PainSc: 0-No pain                 Astra Gregg M Sriyan Cutting

## 2021-02-04 NOTE — Transfer of Care (Signed)
Immediate Anesthesia Transfer of Care Note  Patient: Jasmine Barron  Procedure(s) Performed: COLONOSCOPY WITH PROPOFOL ESOPHAGOGASTRODUODENOSCOPY (EGD)  Patient Location: PACU  Anesthesia Type:General  Level of Consciousness: sedated  Airway & Oxygen Therapy: Patient Spontanous Breathing and Patient connected to nasal cannula oxygen  Post-op Assessment: Report given to RN and Post -op Vital signs reviewed and stable  Post vital signs: Reviewed and stable  Last Vitals:  Vitals Value Taken Time  BP 106/62 02/04/21 1405  Temp 35.8 C 02/04/21 1405  Pulse 71 02/04/21 1408  Resp 13 02/04/21 1408  SpO2 100 % 02/04/21 1408  Vitals shown include unvalidated device data.  Last Pain:  Vitals:   02/04/21 1405  TempSrc: Temporal  PainSc: Asleep         Complications: No notable events documented.

## 2021-02-04 NOTE — H&P (Signed)
Jonathon Bellows, MD [address], [place], [place], [place], [address] [address], [place], [place], [place], [address] Phone: [phone]  Fax: [phone]  Primary Care Physician:  Lavera Guise, MD   Pre-Procedure History & Physical: HPI:  CLAIRISSA VALVANO is a 65 y.o. female is here for an endoscopy and colonoscopy    Past Medical History:  Diagnosis Date   Anxiety    High cholesterol    Hypertension     Past Surgical History:  Procedure Laterality Date   COLONOSCOPY     TONSILLECTOMY Bilateral     Prior to Admission medications   Medication Sig Start Date End Date Taking? Authorizing Provider  citalopram (CELEXA) 40 MG tablet Take 1 tablet by mouth once daily 01/04/21  Yes Lavera Guise, MD  clonazePAM Bobbye [place]) 0.5 MG tablet Take one tab po qhs for anxiety and insomnia 11/24/20  Yes Lavera Guise, MD  Omega-3 Fatty Acids (FISH OIL) 1000 MG CAPS Take 1 capsule by mouth daily.   Yes [provider]  polycarbophil (FIBERCON) 625 MG tablet Take 625 mg by mouth daily.   Yes [provider]  calcium citrate-vitamin D (CITRACAL+D) 315-200 MG-UNIT tablet Take 1 tablet by mouth 2 (two) times daily.    [provider]    Allergies as of 01/22/2021 - Review Complete 01/22/2021  Allergen Reaction Noted   Amoxicillin Hives 01/22/2021   No known allergies  01/22/2021    Family History  Problem Relation Age of Onset   Diabetes Mother    Heart disease Mother    Hypertension Mother    Stroke Father    Diabetes Brother    Diabetes Maternal Grandmother     Social History   Socioeconomic History   Marital status: Divorced    Spouse name: Not on file   Number of children: Not on file   Years of education: Not on file   Highest education level: Not on file  Occupational History   Not on file  Tobacco Use   Smoking status: Never   Smokeless tobacco: Never  Vaping Use   Vaping Use: Never used  Substance and Sexual Activity   Alcohol  use: Yes    Comment: 2-3 times a week wine and rum   Drug use: No   Sexual activity: Not on file  Other Topics Concern   Not on file  Social History Narrative   Not on file   Social Determinants of Health   Financial Resource Strain: Not on file  Food Insecurity: Not on file  Transportation Needs: Not on file  Physical Activity: Not on file  Stress: Not on file  Social Connections: Not on file  Intimate Partner Violence: Not on file    Review of Systems: See HPI, otherwise negative ROS  Physical Exam: BP (!) 148/87   Pulse 66   Temp (!) 96.3 F (35.7 C) (Temporal)   Resp 16   Ht 5\' 5"  (1.651 m)   Wt 65.8 kg   SpO2 100%   BMI 24.13 kg/m  General:   Alert,  pleasant and cooperative in NAD Head:  Normocephalic and atraumatic. Neck:  Supple; no masses or thyromegaly. Lungs:  Clear throughout to auscultation, normal respiratory effort.    Heart:  +S1, +S2, Regular rate and rhythm, No edema. Abdomen:  Soft, nontender and nondistended. Normal bowel sounds, without guarding, and without rebound.   Neurologic:  Alert and  oriented x4;  grossly normal neurologically.  Impression/Plan: ARITZEL KRUSEMARK is here for an endoscopy and colonoscopy  to be performed for  evaluation of dysphagia and colon cancer screening     Risks, benefits, limitations, and alternatives regarding endoscopy have been reviewed with the patient.  Questions have been answered.  All parties agreeable.   Jonathon Bellows, MD  02/04/2021, 1:23 PM

## 2021-02-04 NOTE — Op Note (Signed)
[facility] Gastroenterology Patient Name: Jasmine Barron Procedure Date: 02/04/2021 1:26 PM MRN: [id number] Account #: [id number] Date of Birth: 10-Nov-1955 Admit Type: Outpatient Age: 65 Room: [facility] ENDO ROOM 3 Gender: Female Note Status: Finalized Instrument Name: Altamese Cabal Endoscope [id number] Procedure:             Upper GI endoscopy Indications:           Dysphagia Providers:             Jonathon Bellows MD, MD Medicines:             Monitored Anesthesia Care Complications:         No immediate complications. Procedure:             Pre-Anesthesia Assessment:                        - Prior to the procedure, a History and Physical was                         performed, and patient medications, allergies and                         sensitivities were reviewed. The patient's tolerance                         of previous anesthesia was reviewed.                        - The risks and benefits of the procedure and the                         sedation options and risks were discussed with the                         patient. All questions were answered and informed                         consent was obtained.                        - ASA Grade Assessment: II - A patient with mild                         systemic disease.                        After obtaining informed consent, the endoscope was                         passed under direct vision. Throughout the procedure,                         the patient's blood pressure, pulse, and oxygen                         saturations were monitored continuously. The Endoscope                         was introduced through the mouth, and advanced to the  third part of duodenum. The upper GI endoscopy was                         accomplished with ease. The patient tolerated the                         procedure well. Findings:      The examined duodenum was normal.      A medium-sized hiatal hernia was present.       LA Grade A (one or more mucosal breaks less than 5 mm, not extending       between tops of 2 mucosal folds) esophagitis with no bleeding was found       at the gastroesophageal junction. Biopsies were taken with a cold       forceps for histology.      Normal mucosa was found in the entire esophagus. Biopsies were taken       with a cold forceps for histology.      The cardia and gastric fundus were normal on retroflexion. Impression:            - Normal examined duodenum.                        - Medium-sized hiatal hernia.                        - LA Grade A reflux esophagitis with no bleeding.                         Biopsied.                        - Normal mucosa was found in the entire esophagus.                         Biopsied. Recommendation:        - Await pathology results.                        - Perform a colonoscopy today. Procedure Code(s):     --- Professional ---                        [id number], Esophagogastroduodenoscopy, flexible,                         transoral; with biopsy, single or multiple Diagnosis Code(s):     --- Professional ---                        K44.9, Diaphragmatic hernia without obstruction or                         gangrene                        K21.00, Gastro-esophageal reflux disease with                         esophagitis, without bleeding                        R13.10, Dysphagia, unspecified CPT  copyright 2019 American Medical Association. All rights reserved. The codes documented in this report are preliminary and upon coder review may  be revised to meet current compliance requirements. Jonathon Bellows, MD Jonathon Bellows MD, MD 02/04/2021 1:37:28 PM This report has been signed electronically. Number of Addenda: 0 Note Initiated On: 02/04/2021 1:26 PM Estimated Blood Loss:  Estimated blood loss: none.      [facility]

## 2021-02-04 NOTE — Anesthesia Preprocedure Evaluation (Addendum)
Anesthesia Evaluation  Patient identified by MRN, date of birth, ID band Patient awake    Reviewed: Allergy & Precautions, NPO status , Patient's Chart, lab work & pertinent test results  History of Anesthesia Complications Negative for: history of anesthetic complications  Airway Mallampati: II  TM Distance: >3 FB Neck ROM: Full  Mouth opening: Limited Mouth Opening  Dental   Pulmonary neg pulmonary ROS,    Pulmonary exam normal        Cardiovascular hypertension, Normal cardiovascular exam     Neuro/Psych Anxiety Depression negative neurological ROS     GI/Hepatic negative GI ROS, Neg liver ROS,   Endo/Other  negative endocrine ROS  Renal/GU negative Renal ROS  negative genitourinary   Musculoskeletal negative musculoskeletal ROS (+)   Abdominal   Peds  Hematology   Large B cell lymphoma   Anesthesia Other Findings   Reproductive/Obstetrics                            Anesthesia Physical Anesthesia Plan  ASA: 3  Anesthesia Plan: General   Post-op Pain Management:    Induction:   PONV Risk Score and Plan:   Airway Management Planned:   Additional Equipment:   Intra-op Plan:   Post-operative Plan:   Informed Consent: I have reviewed the patients History and Physical, chart, labs and discussed the procedure including the risks, benefits and alternatives for the proposed anesthesia with the patient or authorized representative who has indicated his/her understanding and acceptance.       Plan Discussed with: CRNA  Anesthesia Plan Comments:         Anesthesia Quick Evaluation

## 2021-02-04 NOTE — Op Note (Signed)
[facility] Gastroenterology Patient Name: Jasmine Barron Procedure Date: 02/04/2021 1:25 PM MRN: [id number] Account #: [id number] Date of Birth: December 03, 1955 Admit Type: Outpatient Age: 65 Room: [facility] ENDO ROOM 3 Gender: Female Note Status: Finalized Instrument Name: Jasper Riling [id number] Procedure:             Colonoscopy Indications:           Screening for colorectal malignant neoplasm Providers:             Jonathon Bellows MD, MD Medicines:             Monitored Anesthesia Care Complications:         No immediate complications. Procedure:             Pre-Anesthesia Assessment:                        - Prior to the procedure, a History and Physical was                         performed, and patient medications, allergies and                         sensitivities were reviewed. The patient's tolerance                         of previous anesthesia was reviewed.                        - The risks and benefits of the procedure and the                         sedation options and risks were discussed with the                         patient. All questions were answered and informed                         consent was obtained.                        - ASA Grade Assessment: II - A patient with mild                         systemic disease.                        After obtaining informed consent, the colonoscope was                         passed under direct vision. Throughout the procedure,                         the patient's blood pressure, pulse, and oxygen                         saturations were monitored continuously. The                         Colonoscope was introduced through the anus and  advanced to the the cecum, identified by the                         appendiceal orifice. The colonoscopy was performed                         with ease. The patient tolerated the procedure well.                         The quality of the bowel preparation  was good. Findings:      The perianal and digital rectal examinations were normal.      A 7 mm polyp was found in the cecum. The polyp was sessile. The polyp       was removed with a cold snare. Resection and retrieval were complete.      A 12 mm polyp was found in the cecum. The polyp was sessile.       Preparations were made for mucosal resection. Saline was injected to       raise the lesion. Snare mucosal resection was performed. Resection and       retrieval were complete. To prevent bleeding after mucosal resection,       one hemostatic clip was successfully placed. There was no bleeding       during, or at the end, of the procedure. Borders of the polyp demarcated       using blue or NBI light      The exam was otherwise without abnormality on direct and retroflexion       views. Impression:            - One 7 mm polyp in the cecum, removed with a cold                         snare. Resected and retrieved.                        - One 12 mm polyp in the cecum, removed with mucosal                         resection. Resected and retrieved. Clip was placed.                        - The examination was otherwise normal on direct and                         retroflexion views.                        - Mucosal resection was performed. Resection and                         retrieval were complete. Recommendation:        - Discharge patient to home (with escort).                        - Resume previous diet.                        - Continue present medications.                        -  Await pathology results.                        - Repeat colonoscopy for surveillance based on                         pathology results.                        - Return to GI office as previously scheduled. Procedure Code(s):     --- Professional ---                        [phone], Colonoscopy, flexible; with endoscopic mucosal                         resection                        [id number], 37,  Colonoscopy, flexible; with removal of                         tumor(s), polyp(s), or other lesion(s) by snare                         technique Diagnosis Code(s):     --- Professional ---                        Z12.11, Encounter for screening for malignant neoplasm                         of colon                        K63.5, Polyp of colon CPT copyright 2019 American Medical Association. All rights reserved. The codes documented in this report are preliminary and upon coder review may  be revised to meet current compliance requirements. Jonathon Bellows, MD Jonathon Bellows MD, MD 02/04/2021 2:06:03 PM This report has been signed electronically. Number of Addenda: 0 Note Initiated On: 02/04/2021 1:25 PM Scope Withdrawal Time: 0 hours 15 minutes 10 seconds  Total Procedure Duration: 0 hours 21 minutes 21 seconds  Estimated Blood Loss:  Estimated blood loss: none.      [facility]

## 2021-02-06 DIAGNOSIS — R030 Elevated blood-pressure reading, without diagnosis of hypertension: Secondary | ICD-10-CM | POA: Diagnosis not present

## 2021-02-06 DIAGNOSIS — Z8249 Family history of ischemic heart disease and other diseases of the circulatory system: Secondary | ICD-10-CM | POA: Diagnosis not present

## 2021-02-06 DIAGNOSIS — F411 Generalized anxiety disorder: Secondary | ICD-10-CM | POA: Diagnosis not present

## 2021-02-06 DIAGNOSIS — F3342 Major depressive disorder, recurrent, in full remission: Secondary | ICD-10-CM | POA: Diagnosis not present

## 2021-02-06 LAB — SURGICAL PATHOLOGY

## 2021-02-17 ENCOUNTER — Telehealth: Payer: Self-pay

## 2021-02-17 NOTE — Telephone Encounter (Signed)
Left vm to confirm 02/23/21 appointment-Toni

## 2021-02-23 ENCOUNTER — Encounter: Payer: Medicare PPO | Admitting: Nurse Practitioner

## 2021-02-25 ENCOUNTER — Encounter: Payer: Self-pay | Admitting: Gastroenterology

## 2021-02-26 ENCOUNTER — Ambulatory Visit: Payer: Medicare PPO | Admitting: Nurse Practitioner

## 2021-03-11 ENCOUNTER — Other Ambulatory Visit: Payer: Self-pay | Admitting: Internal Medicine

## 2021-03-11 DIAGNOSIS — G4709 Other insomnia: Secondary | ICD-10-CM

## 2021-04-22 ENCOUNTER — Encounter: Payer: Self-pay | Admitting: Nurse Practitioner

## 2021-04-22 ENCOUNTER — Ambulatory Visit (INDEPENDENT_AMBULATORY_CARE_PROVIDER_SITE_OTHER): Payer: Medicare PPO | Admitting: Nurse Practitioner

## 2021-04-22 ENCOUNTER — Other Ambulatory Visit: Payer: Self-pay

## 2021-04-22 VITALS — BP 140/85 | HR 85 | Temp 98.3°F | Resp 16 | Ht 65.0 in | Wt 147.2 lb

## 2021-04-22 DIAGNOSIS — Z0001 Encounter for general adult medical examination with abnormal findings: Secondary | ICD-10-CM | POA: Diagnosis not present

## 2021-04-22 DIAGNOSIS — K219 Gastro-esophageal reflux disease without esophagitis: Secondary | ICD-10-CM | POA: Diagnosis not present

## 2021-04-22 DIAGNOSIS — D229 Melanocytic nevi, unspecified: Secondary | ICD-10-CM | POA: Diagnosis not present

## 2021-04-22 DIAGNOSIS — E559 Vitamin D deficiency, unspecified: Secondary | ICD-10-CM

## 2021-04-22 DIAGNOSIS — F321 Major depressive disorder, single episode, moderate: Secondary | ICD-10-CM

## 2021-04-22 DIAGNOSIS — R300 Dysuria: Secondary | ICD-10-CM | POA: Diagnosis not present

## 2021-04-22 DIAGNOSIS — E782 Mixed hyperlipidemia: Secondary | ICD-10-CM | POA: Diagnosis not present

## 2021-04-22 DIAGNOSIS — G4709 Other insomnia: Secondary | ICD-10-CM | POA: Diagnosis not present

## 2021-04-22 DIAGNOSIS — E039 Hypothyroidism, unspecified: Secondary | ICD-10-CM | POA: Diagnosis not present

## 2021-04-22 DIAGNOSIS — R7301 Impaired fasting glucose: Secondary | ICD-10-CM | POA: Diagnosis not present

## 2021-04-22 MED ORDER — CLONAZEPAM 0.5 MG PO TABS: ORAL_TABLET | ORAL | 2 refills | Status: DC

## 2021-04-22 MED ORDER — CITALOPRAM HYDROBROMIDE 40 MG PO TABS: 40.0000 mg | ORAL_TABLET | Freq: Every day | ORAL | 3 refills | Status: DC

## 2021-04-22 NOTE — Progress Notes (Signed)
[facility] [place], [place] [address]  Internal MEDICINE  Office Visit Note  Patient Name: Jasmine Barron  [id number]  [id number]  Date of Service: 04/22/2021  Chief Complaint  Patient presents with   Annual Exam    Mole in groin area grows and is raised, pt has ignored it for 10 years, wants blood work    Hypertension   Results   Hyperlipidemia   Anxiety    HPI Jasmine Barron presents for an annual well visit and physical exam. Jasmine Barron is a well appearing 66 yo Barron with hypertension and hyperlipidemia. Her blood pressure is slightly elevated but improved when rechecked, see vitals. Jasmine Barron had her mammogram and BMD screening in September 2022. Her mammogram was normal and her BMD showed osteopenia. Jasmine Barron had a colonoscopy in November 2022 and 2 precancerous polyps were removed. Jasmine Barron does have significant acid reflux but it has improved. Jasmine Barron is due for routine labs and Jasmine Barron needs refills.  Her only concern today is a mole in her right groin that has been slowly growing over the past 10 years and Jasmine Barron is concerned about it possibly being cancerous.     Current Medication: Outpatient Encounter Medications as of 04/22/2021  Medication Sig   calcium citrate-vitamin D (CITRACAL+D) 315-200 MG-UNIT tablet Take 1 tablet by mouth 2 (two) times daily.   Omega-3 Fatty Acids (FISH OIL) 1000 MG CAPS Take 1 capsule by mouth daily.   polycarbophil (FIBERCON) 625 MG tablet Take 625 mg by mouth daily.   [DISCONTINUED] citalopram (CELEXA) 40 MG tablet Take 1 tablet by mouth once daily   [DISCONTINUED] clonazePAM (KLONOPIN) 0.5 MG tablet Take one tab po qhs for anxiety and insomnia   citalopram (CELEXA) 40 MG tablet Take 1 tablet (40 mg total) by mouth daily.   clonazePAM (KLONOPIN) 0.5 MG tablet Take one tab po qhs for anxiety and insomnia   No facility-administered encounter medications on file as of 04/22/2021.    Surgical History: Past Surgical History:  Procedure Laterality Date    COLONOSCOPY     COLONOSCOPY WITH PROPOFOL N/A 02/04/2021   Procedure: COLONOSCOPY WITH PROPOFOL;  Surgeon: Jonathon Bellows, MD;  Location: [facility] ENDOSCOPY;  Service: Gastroenterology;  Laterality: N/A;   ESOPHAGOGASTRODUODENOSCOPY N/A 02/04/2021   Procedure: ESOPHAGOGASTRODUODENOSCOPY (EGD);  Surgeon: Jonathon Bellows, MD;  Location: [facility] ENDOSCOPY;  Service: Gastroenterology;  Laterality: N/A;   TONSILLECTOMY Bilateral     Medical History: Past Medical History:  Diagnosis Date   Anxiety    High cholesterol    Hypertension     Family History: Family History  Problem Relation Age of Onset   Diabetes Mother    Heart disease Mother    Hypertension Mother    Stroke Father    Diabetes Brother    Diabetes Maternal Grandmother     Social History   Socioeconomic History   Marital status: Divorced    Spouse name: Not on file   Number of children: Not on file   Years of education: Not on file   Highest education level: Not on file  Occupational History   Not on file  Tobacco Use   Smoking status: Never   Smokeless tobacco: Never  Vaping Use   Vaping Use: Never used  Substance and Sexual Activity   Alcohol use: Yes    Comment: 2-3 times a week wine and rum   Drug use: No   Sexual activity: Not on file  Other Topics Concern   Not on file  Social  History Narrative   Not on file   Social Determinants of Health   Financial Resource Strain: Not on file  Food Insecurity: Not on file  Transportation Needs: Not on file  Physical Activity: Not on file  Stress: Not on file  Social Connections: Not on file  Intimate Partner Violence: Not on file      Review of Systems  Constitutional:  Negative for activity change, appetite change, chills, fatigue, fever and unexpected weight change.  HENT: Negative.  Negative for congestion, ear pain, rhinorrhea, sore throat and trouble swallowing.   Eyes: Negative.   Respiratory: Negative.  Negative for cough, chest tightness, shortness of breath  and wheezing.   Cardiovascular: Negative.  Negative for chest pain.  Gastrointestinal: Negative.  Negative for abdominal pain, blood in stool, constipation, diarrhea, nausea and vomiting.  Endocrine: Negative.   Genitourinary: Negative.  Negative for difficulty urinating, dysuria, frequency, hematuria and urgency.  Musculoskeletal: Negative.  Negative for arthralgias, back pain, joint swelling, myalgias and neck pain.  Skin: Negative.  Negative for rash and wound.  Allergic/Immunologic: Negative.  Negative for immunocompromised state.  Neurological: Negative.  Negative for dizziness, seizures, numbness and headaches.  Hematological: Negative.   Psychiatric/Behavioral: Negative.  Negative for behavioral problems, self-injury and suicidal ideas. The patient is not nervous/anxious.    Vital Signs: BP 140/85 Comment: 150/92   Pulse 85    Temp 98.3 F (36.8 C)    Resp 16    Ht '5\' 5"'  (1.651 m)    Wt 147 lb 3.2 oz (66.8 kg)    SpO2 98%    BMI 24.50 kg/m    Physical Exam Vitals reviewed.  Constitutional:      General: Jasmine Barron is awake. Jasmine Barron is not in acute distress.    Appearance: Normal appearance. Jasmine Barron is well-developed, well-groomed and normal weight. Jasmine Barron is not ill-appearing or diaphoretic.  HENT:     Head: Normocephalic and atraumatic.     Right Ear: Tympanic membrane, ear canal and external ear normal.     Left Ear: Tympanic membrane, ear canal and external ear normal.     Nose: Nose normal. No congestion or rhinorrhea.     Mouth/Throat:     Lips: Pink.     Mouth: Mucous membranes are moist.     Pharynx: Oropharynx is clear. Uvula midline. No oropharyngeal exudate or posterior oropharyngeal erythema.  Eyes:     General: Lids are normal. Vision grossly intact. Gaze aligned appropriately. No scleral icterus.       Right eye: No discharge.        Left eye: No discharge.     Extraocular Movements: Extraocular movements intact.     Conjunctiva/sclera: Conjunctivae normal.     Pupils: Pupils  are equal, round, and reactive to light.     Funduscopic exam:    Right eye: Red reflex present.        Left eye: Red reflex present. Neck:     Thyroid: No thyromegaly.     Vascular: No JVD.     Trachea: Trachea and phonation normal. No tracheal deviation.  Cardiovascular:     Rate and Rhythm: Normal rate and regular rhythm.     Pulses: Normal pulses.     Heart sounds: Normal heart sounds, S1 normal and S2 normal. No murmur heard.   No friction rub. No gallop.  Pulmonary:     Effort: Pulmonary effort is normal. No accessory muscle usage or respiratory distress.     Breath sounds: Normal breath  sounds and air entry. No stridor. No wheezing or rales.  Chest:     Chest wall: No tenderness.     Comments: Declined breast exam, gets annual mammograms.  Abdominal:     General: Bowel sounds are normal. There is no distension.     Palpations: Abdomen is soft. There is no shifting dullness, fluid wave, mass or pulsatile mass.     Tenderness: There is no abdominal tenderness. There is no guarding or rebound.  Musculoskeletal:        General: No tenderness or deformity. Normal range of motion.     Cervical back: Normal range of motion and neck supple.     Right lower leg: No edema.     Left lower leg: No edema.  Lymphadenopathy:     Cervical: No cervical adenopathy.  Skin:    General: Skin is warm and dry.     Capillary Refill: Capillary refill takes less than 2 seconds.     Coloration: Skin is not pale.     Findings: No erythema or rash.  Neurological:     Mental Status: Jasmine Barron is alert and oriented to person, place, and time.     Cranial Nerves: No cranial nerve deficit.     Motor: No abnormal muscle tone.     Coordination: Coordination normal.     Gait: Gait normal.     Deep Tendon Reflexes: Reflexes are normal and symmetric.  Psychiatric:        Mood and Affect: Mood normal.        Behavior: Behavior normal. Behavior is cooperative.        Thought Content: Thought content normal.         Judgment: Judgment normal.       Assessment/Plan: 1. Encounter for general adult medical examination with abnormal findings Age-appropriate preventive screenings and vaccinations discussed, annual physical exam completed. Routine labs for health maintenance ordered, see below. PHM updated.  - CBC with Differential/Platelet - CMP14+EGFR - TSH + free T4 - Vitamin D (25 hydroxy)  2. Atypical mole Refer to dermatology  - Ambulatory referral to Dermatology  3. GERD without esophagitis Stable, taking OTC prilosec which is controlling her symptoms.   4. Other insomnia Stable, refills ordered. - clonazePAM (KLONOPIN) 0.5 MG tablet; Take one tab po qhs for anxiety and insomnia  Dispense: 30 tablet; Refill: 2  5. Mixed hyperlipidemia Routine lab ordered - Lipid Profile  6. Vitamin D deficiency Routine lab ordered.  - Vitamin D (25 hydroxy)  7. Impaired fasting glucose Routine labs ordered.  - CMP14+EGFR - Hemoglobin A1c  8. Hypothyroidism, unspecified type Routine lab ordered.  - TSH + free T4  9. Dysuria Routine urinalysis done - UA/M w/rflx Culture, Routine  10. Moderate major depression (HCC) Stable, refills ordered. Jasmine Barron did not take it last month but wants to get back on it now,  - citalopram (CELEXA) 40 MG tablet; Take 1 tablet (40 mg total) by mouth daily.  Dispense: 30 tablet; Refill: 3      General Counseling: Dalyce verbalizes understanding of the findings of todays visit and agrees with plan of treatment. I have discussed any further diagnostic evaluation that may be needed or ordered today. We also reviewed her medications today. Jasmine Barron has been encouraged to call the office with any questions or concerns that should arise related to todays visit.    Orders Placed This Encounter  Procedures   CBC with Differential/Platelet   CMP14+EGFR   Lipid Profile   TSH +  free T4   Vitamin D (25 hydroxy)   UA/M w/rflx Culture, Routine   Hemoglobin A1c   Ambulatory  referral to Dermatology    Meds ordered this encounter  Medications   citalopram (CELEXA) 40 MG tablet    Sig: Take 1 tablet (40 mg total) by mouth daily.    Dispense:  30 tablet    Refill:  3   clonazePAM (KLONOPIN) 0.5 MG tablet    Sig: Take one tab po qhs for anxiety and insomnia    Dispense:  30 tablet    Refill:  2    Return in about 3 months (around 07/21/2021) for F/U, med refill, Airyanna Dipalma PCP.   Total time spent:30 Minutes Time spent includes review of chart, medications, test results, and follow up plan with the patient.   [place] Controlled Substance Database was reviewed by me.  This patient was seen by Jonetta Osgood, FNP-C in collaboration with Dr. Clayborn Bigness as a part of collaborative care agreement.  Emmalina Espericueta R. Valetta Fuller, MSN, FNP-C Internal medicine

## 2021-04-23 ENCOUNTER — Encounter: Payer: Self-pay | Admitting: Nurse Practitioner

## 2021-04-25 LAB — UA/M W/RFLX CULTURE, ROUTINE
Bilirubin, UA: NEGATIVE
Glucose, UA: NEGATIVE
Nitrite, UA: NEGATIVE
RBC, UA: NEGATIVE
Specific Gravity, UA: 1.025 (ref 1.005–1.030)
Urobilinogen, Ur: 1 mg/dL (ref 0.2–1.0)
pH, UA: 5.5 (ref 5.0–7.5)

## 2021-04-25 LAB — MICROSCOPIC EXAMINATION
Bacteria, UA: NONE SEEN
Casts: NONE SEEN /lpf

## 2021-04-25 LAB — URINE CULTURE, REFLEX: Organism ID, Bacteria: NO GROWTH

## 2021-04-30 ENCOUNTER — Ambulatory Visit: Payer: Medicare PPO | Admitting: Gastroenterology

## 2021-04-30 NOTE — Progress Notes (Deleted)
Jonathon Bellows MD, MRCP(U.K) [address]  [place]  [place], [place] [address]  Main: [phone]  Fax: [phone]   Primary Care Physician: Lavera Guise, MD  Primary Gastroenterologist:  Dr. Jonathon Bellows   No chief complaint on file.   HPI: Jasmine Barron is a 66 y.o. female     Jonathon Bellows MD, MRCP(U.K) [address]  [place]  [place], [place] [address]  Main: [phone]  Fax: [phone]   Primary Care Physician: Lavera Guise, MD  Primary Gastroenterologist:  Dr. Jonathon Bellows   No chief complaint on file.   HPI: Jasmine Barron is a 66 y.o. female   Summary of history :  Initially was referred back in 12/2020 for reflux.  she has had over 15 years.  She takes Prilosec 20 mg of 40 mg which she is not too sure as needed.  Does not have complete resolution of symptoms wakes up at times at night with heartburn and chest discomfort and sometimes wakes up in the morning with discomfort persisting all day long.  She has lost weight changes the way she eats which is helped to certain degree.  She is also noted since summer that she has had difficulty swallowing solids at times and liquids at times feels like it gets stuck in the center of her chest.  She is also due for a colonoscopy which she has last had over 15 years back.  No family or personal history of colon polyps or cancers.  No lower GI symptoms such as change in bowel habits or rectal bleeding or change in the caliber of her stool.  She suffers from constipation which is longstanding and she has tried Amitiza which is failed    Interval history   01/22/2021-04/30/2021  02/04/2021: EGD: medium sized hiatal hernia and grade A esophagitis.Bx- esophagitis. Colonoscopy polyps x2 resected tubular adenomas.     ***   Current Outpatient Medications  Medication Sig Dispense Refill   calcium citrate-vitamin D (CITRACAL+D) 315-200 MG-UNIT tablet Take 1 tablet by mouth 2 (two) times daily.     citalopram  (CELEXA) 40 MG tablet Take 1 tablet (40 mg total) by mouth daily. 30 tablet 3   clonazePAM (KLONOPIN) 0.5 MG tablet Take one tab po qhs for anxiety and insomnia 30 tablet 2   Omega-3 Fatty Acids (FISH OIL) 1000 MG CAPS Take 1 capsule by mouth daily.     polycarbophil (FIBERCON) 625 MG tablet Take 625 mg by mouth daily.     No current facility-administered medications for this visit.    Allergies as of 04/30/2021 - Review Complete 04/23/2021  Allergen Reaction Noted   No known allergies  01/22/2021    ROS:  General: Negative for anorexia, weight loss, fever, chills, fatigue, weakness. ENT: Negative for hoarseness, difficulty swallowing , nasal congestion. CV: Negative for chest pain, angina, palpitations, dyspnea on exertion, peripheral edema.  Respiratory: Negative for dyspnea at rest, dyspnea on exertion, cough, sputum, wheezing.  GI: See history of present illness. GU:  Negative for dysuria, hematuria, urinary incontinence, urinary frequency, nocturnal urination.  Endo: Negative for unusual weight change.    Physical Examination:   There were no vitals taken for this visit.  General: Well-nourished, well-developed in no acute distress.  Eyes: No icterus. Conjunctivae pink. Mouth: Oropharyngeal mucosa moist and pink , no lesions erythema or exudate. Lungs: Clear to auscultation bilaterally. Non-labored. Heart: Regular rate and rhythm, no murmurs rubs or gallops.  Abdomen: Bowel sounds  are normal, nontender, nondistended, no hepatosplenomegaly or masses, no abdominal bruits or hernia , no rebound or guarding.   Extremities: No lower extremity edema. No clubbing or deformities. Neuro: Alert and oriented x 3.  Grossly intact. Skin: Warm and dry, no jaundice.   Psych: Alert and cooperative, normal mood and affect.   Imaging Studies: No results found.  Assessment and Plan:   Jasmine Barron is a 65 y.o. y/o female ***    Dr Jonathon Bellows  MD,MRCP [facility]) Follow up in ***    Current Outpatient Medications  Medication Sig Dispense Refill   calcium citrate-vitamin D (CITRACAL+D) 315-200 MG-UNIT tablet Take 1 tablet by mouth 2 (two) times daily.     citalopram (CELEXA) 40 MG tablet Take 1 tablet (40 mg total) by mouth daily. 30 tablet 3   clonazePAM (KLONOPIN) 0.5 MG tablet Take one tab po qhs for anxiety and insomnia 30 tablet 2   Omega-3 Fatty Acids (FISH OIL) 1000 MG CAPS Take 1 capsule by mouth daily.     polycarbophil (FIBERCON) 625 MG tablet Take 625 mg by mouth daily.     No current facility-administered medications for this visit.    Allergies as of 04/30/2021 - Review Complete 04/23/2021  Allergen Reaction Noted   No known allergies  01/22/2021    ROS:  General: Negative for anorexia, weight loss, fever, chills, fatigue, weakness. ENT: Negative for hoarseness, difficulty swallowing , nasal congestion. CV: Negative for chest pain, angina, palpitations, dyspnea on exertion, peripheral edema.  Respiratory: Negative for dyspnea at rest, dyspnea on exertion, cough, sputum, wheezing.  GI: See history of present illness. GU:  Negative for dysuria, hematuria, urinary incontinence, urinary frequency, nocturnal urination.  Endo: Negative for unusual weight change.    Physical Examination:   There were no vitals taken for this visit.  General: Well-nourished, well-developed in no acute distress.  Eyes: No icterus. Conjunctivae pink. Mouth: Oropharyngeal mucosa moist and pink , no lesions erythema or exudate. Lungs: Clear to auscultation bilaterally. Non-labored. Heart: Regular rate and rhythm, no murmurs rubs or gallops.  Abdomen: Bowel sounds are normal, nontender, nondistended, no hepatosplenomegaly or masses, no abdominal bruits or hernia , no rebound or guarding.   Extremities: No lower extremity edema. No clubbing or deformities. Neuro: Alert and oriented x 3.  Grossly intact. Skin: Warm and dry, no jaundice.   Psych: Alert and cooperative,  normal mood and affect.   Imaging Studies: No results found.  Assessment and Plan:   Jasmine Barron is a 66 y.o. y/o female here to follow up for Constipation , GERD and dysphagia.  Longstanding for over 15 years. Not tried any Linzess.  We will see.  Amitiza has not helped.  Due for screening colonoscopy average risk.  Plan 1.  Commence on Prilosec 40 mg on a daily basis fasting in the morning on empty stomach. 2..  Commence on Linzess 290 mcg daily for 1 week samples have been provided.  If it works she will call for prescription. 4.  GERD patient information provided counseled on lifestyle changes.  With picture of a wedge pillow provided for her to try    Dr Jonathon Bellows  MD,MRCP [facility]) Follow up in ***

## 2021-06-03 DIAGNOSIS — D229 Melanocytic nevi, unspecified: Secondary | ICD-10-CM | POA: Diagnosis not present

## 2021-06-03 DIAGNOSIS — E782 Mixed hyperlipidemia: Secondary | ICD-10-CM | POA: Diagnosis not present

## 2021-06-03 DIAGNOSIS — G4709 Other insomnia: Secondary | ICD-10-CM | POA: Diagnosis not present

## 2021-06-03 DIAGNOSIS — F321 Major depressive disorder, single episode, moderate: Secondary | ICD-10-CM | POA: Diagnosis not present

## 2021-06-03 DIAGNOSIS — K219 Gastro-esophageal reflux disease without esophagitis: Secondary | ICD-10-CM | POA: Diagnosis not present

## 2021-06-03 DIAGNOSIS — M8589 Other specified disorders of bone density and structure, multiple sites: Secondary | ICD-10-CM | POA: Diagnosis not present

## 2021-06-03 DIAGNOSIS — I10 Essential (primary) hypertension: Secondary | ICD-10-CM | POA: Diagnosis not present

## 2021-06-03 DIAGNOSIS — K5909 Other constipation: Secondary | ICD-10-CM | POA: Diagnosis not present

## 2021-06-03 DIAGNOSIS — E559 Vitamin D deficiency, unspecified: Secondary | ICD-10-CM | POA: Diagnosis not present

## 2021-06-04 LAB — CBC WITH DIFFERENTIAL/PLATELET
Basophils Absolute: 0 10*3/uL (ref 0.0–0.2)
Basos: 1 %
EOS (ABSOLUTE): 0 10*3/uL (ref 0.0–0.4)
Eos: 0 %
Hematocrit: 39.4 % (ref 34.0–46.6)
Hemoglobin: 13.4 g/dL (ref 11.1–15.9)
Immature Grans (Abs): 0 10*3/uL (ref 0.0–0.1)
Immature Granulocytes: 0 %
Lymphocytes Absolute: 2.2 10*3/uL (ref 0.7–3.1)
Lymphs: 36 %
MCH: 28.8 pg (ref 26.6–33.0)
MCHC: 34 g/dL (ref 31.5–35.7)
MCV: 85 fL (ref 79–97)
Monocytes Absolute: 0.6 10*3/uL (ref 0.1–0.9)
Monocytes: 11 %
Neutrophils Absolute: 3.1 10*3/uL (ref 1.4–7.0)
Neutrophils: 52 %
Platelets: 280 10*3/uL (ref 150–450)
RBC: 4.66 x10E6/uL (ref 3.77–5.28)
RDW: 13.1 % (ref 11.7–15.4)
WBC: 5.9 10*3/uL (ref 3.4–10.8)

## 2021-06-04 LAB — CMP14+EGFR
ALT: 17 IU/L (ref 0–32)
AST: 22 IU/L (ref 0–40)
Albumin/Globulin Ratio: 1.6 (ref 1.2–2.2)
Albumin: 4.9 g/dL — ABNORMAL HIGH (ref 3.8–4.8)
Alkaline Phosphatase: 67 IU/L (ref 44–121)
BUN/Creatinine Ratio: 37 — ABNORMAL HIGH (ref 12–28)
BUN: 22 mg/dL (ref 8–27)
Bilirubin Total: 0.3 mg/dL (ref 0.0–1.2)
CO2: 22 mmol/L (ref 20–29)
Calcium: 10.3 mg/dL (ref 8.7–10.3)
Chloride: 103 mmol/L (ref 96–106)
Creatinine, Ser: 0.59 mg/dL (ref 0.57–1.00)
Globulin, Total: 3 g/dL (ref 1.5–4.5)
Glucose: 83 mg/dL (ref 70–99)
Potassium: 3.9 mmol/L (ref 3.5–5.2)
Sodium: 138 mmol/L (ref 134–144)
Total Protein: 7.9 g/dL (ref 6.0–8.5)
eGFR: 99 mL/min/{1.73_m2} (ref >59)

## 2021-06-04 LAB — HEMOGLOBIN A1C
Est. average glucose Bld gHb Est-mCnc: 108 mg/dL
Hgb A1c MFr Bld: 5.4 % (ref 4.8–5.6)

## 2021-06-04 LAB — TSH+FREE T4
Free T4: 1.02 ng/dL (ref 0.82–1.77)
TSH: 2.43 u[IU]/mL (ref 0.450–4.500)

## 2021-06-04 LAB — LIPID PANEL
Chol/HDL Ratio: 5.1 ratio — ABNORMAL HIGH (ref 0.0–4.4)
Cholesterol, Total: 289 mg/dL — ABNORMAL HIGH (ref 100–199)
HDL: 57 mg/dL (ref >39)
LDL Chol Calc (NIH): 208 mg/dL — ABNORMAL HIGH (ref 0–99)
Triglycerides: 133 mg/dL (ref 0–149)
VLDL Cholesterol Cal: 24 mg/dL (ref 5–40)

## 2021-06-04 LAB — VITAMIN D 25 HYDROXY (VIT D DEFICIENCY, FRACTURES): Vit D, 25-Hydroxy: 33.8 ng/mL (ref 30.0–100.0)

## 2021-07-15 ENCOUNTER — Encounter: Payer: Self-pay | Admitting: Nurse Practitioner

## 2021-07-15 ENCOUNTER — Ambulatory Visit: Payer: Medicare PPO | Admitting: Nurse Practitioner

## 2021-07-15 VITALS — BP 125/86 | HR 66 | Temp 97.9°F | Resp 16 | Ht 65.0 in | Wt 151.8 lb

## 2021-07-15 DIAGNOSIS — E559 Vitamin D deficiency, unspecified: Secondary | ICD-10-CM | POA: Diagnosis not present

## 2021-07-15 DIAGNOSIS — G4709 Other insomnia: Secondary | ICD-10-CM | POA: Diagnosis not present

## 2021-07-15 DIAGNOSIS — E782 Mixed hyperlipidemia: Secondary | ICD-10-CM | POA: Diagnosis not present

## 2021-07-15 MED ORDER — CLONAZEPAM 0.5 MG PO TABS: ORAL_TABLET | ORAL | 2 refills | Status: DC

## 2021-07-15 MED ORDER — ROSUVASTATIN CALCIUM 5 MG PO TABS: 5.0000 mg | ORAL_TABLET | Freq: Every day | ORAL | 1 refills | Status: DC

## 2021-07-15 NOTE — Progress Notes (Signed)
[facility] ?[address] ?[place], [place] [address] ? ?Internal MEDICINE  ?Office Visit Note ? ?Patient Name: Jasmine Barron ? [id number]  ?[id number] ? ?Date of Service: 07/15/2021 ? ?Chief Complaint  ?Patient presents with  ? Follow-up  ? Hyperlipidemia  ? Hypertension  ? Anxiety  ? Medication Refill  ? ? ?HPI ?Jasmine Barron presents for a follow-up visit for hypertension, hyperlipidemia, lab results and medication refills.  Her CBC, thyroid level and metabolic panel are all grossly normal with her kidney and liver function being normal as well.  Her vitamin D level is low normal at 33.8.  Her cholesterol levels are significantly elevated with her total cholesterol being 289 and her LDL more than doubling in the past year at 208.  Her HDL, triglyceride and VLDL are normal.  Her 10-year ASCVD risk score is 7.6%, see below. ? ?The 10-year ASCVD risk score (Arnett DK, et al., 2019) is: 7.6% ?  Values used to calculate the score: ?    Age: 65 years ?    Sex: Female ?    Is Non-Hispanic African American: No ?    Diabetic: No ?    Tobacco smoker: No ?    Systolic Blood Pressure: 568 mmHg ?    Is BP treated: No ?    HDL Cholesterol: 57 mg/dL ?    Total Cholesterol: 289 mg/dL ? ?She is also in need of refills of clonazepam.  Blood pressure is well controlled with current medications. ? ?Current Medication: ?Outpatient Encounter Medications as of 07/15/2021  ?Medication Sig  ? calcium citrate-vitamin D (CITRACAL+D) 315-200 MG-UNIT tablet Take 1 tablet by mouth 2 (two) times daily.  ? citalopram (CELEXA) 40 MG tablet Take 1 tablet (40 mg total) by mouth daily.  ? Omega-3 Fatty Acids (FISH OIL) 1000 MG CAPS Take 1 capsule by mouth daily.  ? polycarbophil (FIBERCON) 625 MG tablet Take 625 mg by mouth daily.  ? rosuvastatin (CRESTOR) 5 MG tablet Take 1 tablet (5 mg total) by mouth daily.  ? [DISCONTINUED] clonazePAM (KLONOPIN) 0.5 MG tablet Take one tab po qhs for anxiety and insomnia  ? clonazePAM (KLONOPIN) 0.5 MG tablet Take  one tab po qhs for anxiety and insomnia  ? ?No facility-administered encounter medications on file as of 07/15/2021.  ? ? ?Surgical History: ?Past Surgical History:  ?Procedure Laterality Date  ? COLONOSCOPY    ? COLONOSCOPY WITH PROPOFOL N/A 02/04/2021  ? Procedure: COLONOSCOPY WITH PROPOFOL;  Surgeon: Jonathon Bellows, MD;  Location: [facility] ENDOSCOPY;  Service: Gastroenterology;  Laterality: N/A;  ? ESOPHAGOGASTRODUODENOSCOPY N/A 02/04/2021  ? Procedure: ESOPHAGOGASTRODUODENOSCOPY (EGD);  Surgeon: Jonathon Bellows, MD;  Location: [facility] ENDOSCOPY;  Service: Gastroenterology;  Laterality: N/A;  ? TONSILLECTOMY Bilateral   ? ? ?Medical History: ?Past Medical History:  ?Diagnosis Date  ? Anxiety   ? High cholesterol   ? Hypertension   ? ? ?Family History: ?Family History  ?Problem Relation Age of Onset  ? Diabetes Mother   ? Heart disease Mother   ? Hypertension Mother   ? Stroke Father   ? Diabetes Brother   ? Diabetes Maternal Grandmother   ? ? ?Social History  ? ?Socioeconomic History  ? Marital status: Divorced  ?  Spouse name: Not on file  ? Number of children: Not on file  ? Years of education: Not on file  ? Highest education level: Not on file  ?Occupational History  ? Not on file  ?Tobacco Use  ? Smoking status: Never  ?  Smokeless tobacco: Never  ?Vaping Use  ? Vaping Use: Never used  ?Substance and Sexual Activity  ? Alcohol use: Yes  ?  Comment: 2-3 times a week wine and rum  ? Drug use: No  ? Sexual activity: Not on file  ?Other Topics Concern  ? Not on file  ?Social History Narrative  ? Not on file  ? ?Social Determinants of Health  ? ?Financial Resource Strain: Not on file  ?Food Insecurity: Not on file  ?Transportation Needs: Not on file  ?Physical Activity: Not on file  ?Stress: Not on file  ?Social Connections: Not on file  ?Intimate Partner Violence: Not on file  ? ? ? ? ?Review of Systems  ?Constitutional:  Negative for chills, fatigue and unexpected weight change.  ?HENT:  Negative for congestion, rhinorrhea,  sneezing and sore throat.   ?Eyes:  Negative for redness.  ?Respiratory:  Negative for cough, chest tightness and shortness of breath.   ?Cardiovascular:  Negative for chest pain and palpitations.  ?Gastrointestinal:  Negative for abdominal pain, constipation, diarrhea, nausea and vomiting.  ?Genitourinary:  Negative for dysuria and frequency.  ?Musculoskeletal:  Negative for arthralgias, back pain, joint swelling and neck pain.  ?Skin:  Negative for rash.  ?Neurological: Negative.  Negative for tremors and numbness.  ?Hematological:  Negative for adenopathy. Does not bruise/bleed easily.  ?Psychiatric/Behavioral:  Negative for behavioral problems (Depression), sleep disturbance and suicidal ideas. The patient is not nervous/anxious.   ? ?Vital Signs: ?BP 125/86   Pulse 66   Temp 97.9 ?F (36.6 ?C)   Resp 16   Ht '5\' 5"'$  (1.651 m)   Wt 151 lb 12.8 oz (68.9 kg)   SpO2 98%   BMI 25.26 kg/m?  ? ? ?Physical Exam ?Vitals reviewed.  ?Constitutional:   ?   General: She is not in acute distress. ?   Appearance: Normal appearance. She is normal weight. She is not ill-appearing.  ?HENT:  ?   Head: Normocephalic and atraumatic.  ?   Mouth/Throat:  ?   Pharynx: No posterior oropharyngeal erythema.  ?Eyes:  ?   Extraocular Movements: Extraocular movements intact.  ?   Pupils: Pupils are equal, round, and reactive to light.  ?Cardiovascular:  ?   Rate and Rhythm: Normal rate and regular rhythm.  ?Pulmonary:  ?   Effort: Pulmonary effort is normal.  ?Neurological:  ?   Mental Status: She is alert and oriented to person, place, and time.  ?Psychiatric:     ?   Mood and Affect: Mood normal.     ?   Behavior: Behavior normal.  ? ? ? ? ? ?Assessment/Plan: ?1. Mixed hyperlipidemia ?Significantly elevated total cholesterol and LDL, rosuvastatin 5 mg daily prescribed.  Due to the elevated LDL and elevated 10-year ASCVD risk, starting statin therapy is highly recommended, high intensity, and diet modifications discussed in detail  regarding limiting red meat intake, increasing lean proteins in diet and adding an omega-3 fish oil supplement if not already doing so. ?- rosuvastatin (CRESTOR) 5 MG tablet; Take 1 tablet (5 mg total) by mouth daily.  Dispense: 90 tablet; Refill: 1 ? ?2. Vitamin D deficiency ?Low normal vitamin D level, recommended that patient continue or start an over-the-counter vitamin D supplement of 2000 units daily or more. ? ?3. Other insomnia ?Patient takes clonazepam to help her sleep at night due to insomnia and increased anxiety.  Refill sent to pharmacy. ?- clonazePAM (KLONOPIN) 0.5 MG tablet; Take one tab po qhs for anxiety  and insomnia  Dispense: 30 tablet; Refill: 2 ? ? ?General Counseling: Jasmine Barron understanding of the findings of todays visit and agrees with plan of treatment. I have discussed any further diagnostic evaluation that may be needed or ordered today. We also reviewed her medications today. she has been encouraged to call the office with any questions or concerns that should arise related to todays visit. ? ? ? ?No orders of the defined types were placed in this encounter. ? ? ?Meds ordered this encounter  ?Medications  ? rosuvastatin (CRESTOR) 5 MG tablet  ?  Sig: Take 1 tablet (5 mg total) by mouth daily.  ?  Dispense:  90 tablet  ?  Refill:  1  ? clonazePAM (KLONOPIN) 0.5 MG tablet  ?  Sig: Take one tab po qhs for anxiety and insomnia  ?  Dispense:  30 tablet  ?  Refill:  2  ? ? ?Return in about 3 months (around 10/14/2021) for F/U, anxiety med refill, Jasmine Barron. ? ? ?Total time spent:30 Minutes ?Time spent includes review of chart, medications, test results, and follow up plan with the patient.  ? ?[place] Controlled Substance Database was reviewed by me. ? ?This patient was seen by Jonetta Osgood, FNP-C in collaboration with Dr. Clayborn Bigness as a part of collaborative care agreement. ? ? ?Jasmine Romagnoli R. Valetta Fuller, MSN, FNP-C ?Internal medicine  ?

## 2021-07-27 ENCOUNTER — Encounter: Payer: Self-pay | Admitting: Gastroenterology

## 2021-07-27 ENCOUNTER — Ambulatory Visit: Payer: Medicare PPO | Admitting: Gastroenterology

## 2021-07-27 VITALS — BP 119/79 | HR 73 | Temp 98.7°F | Wt 151.0 lb

## 2021-07-27 DIAGNOSIS — K219 Gastro-esophageal reflux disease without esophagitis: Secondary | ICD-10-CM | POA: Diagnosis not present

## 2021-07-27 DIAGNOSIS — K5909 Other constipation: Secondary | ICD-10-CM

## 2021-07-27 MED ORDER — LINACLOTIDE 290 MCG PO CAPS: 290.0000 ug | ORAL_CAPSULE | Freq: Every day | ORAL | 3 refills | Status: DC

## 2021-07-27 MED ORDER — OMEPRAZOLE 20 MG PO CPDR: 20.0000 mg | DELAYED_RELEASE_CAPSULE | Freq: Every day | ORAL | 3 refills | Status: DC

## 2021-07-27 NOTE — Progress Notes (Signed)
?  ?Jonathon Bellows MD, MRCP(U.K) ?[place]  ?[address]  ?[place], [place] [address]  ?Main: [phone]  ?Fax: [phone] ? ? ?Primary Care Physician: Jonetta Osgood, NP ? ?Primary Gastroenterologist:  Dr. Jonathon Bellows  ? ?Chief Complaint  ?Patient presents with  ? Gastroesophageal Reflux  ? ? ?HPI: Jasmine Barron is a 66 y.o. female ? ? ?Summary of history : ? ?Initially referred and seen on 01/22/2021 for GERD and colon cancer screening . H/o GERD> 15 years , had been on prilosec for years unsure dose. Did  not have complete resolution of symptoms woke up at times at night with heartburn and chest discomfort and sometimes in the morning with discomfort persisting all day long.  She had lost weight,  changed the way she ate  which is helped to a certain degree. Also noted some dysphagia ? ?Interval history 01/22/2021-07/27/2021 ? ?02/04/2021: EGD+colonoscopy : EGD: medium sized hiatal hernia seen , Grade A esophagitis. Bx showed no EOE and showed mild reflux esophagitis . Colonoscopy cecal polyps x 2 x adenomas. Repeat colonoscopy in 3 years.  ? ?Since her last visit she is doing very well with the Prilosec 40 mg once a day.  She has no reflux symptoms as long as she takes her medication on a daily basis.  If she misses a dose she notices significant heartburn.  She also took the [place] for a week and it significantly helped but when she stopped taking the symptoms return.  She did not call in for prescription.  No other active symptoms or issues at this point of time.  The dysphagia has also resolved. ? ?Current Outpatient Medications  ?Medication Sig Dispense Refill  ? calcium citrate-vitamin D (CITRACAL+D) 315-200 MG-UNIT tablet Take 1 tablet by mouth 2 (two) times daily.    ? citalopram (CELEXA) 40 MG tablet Take 1 tablet (40 mg total) by mouth daily. 30 tablet 3  ? clonazePAM (KLONOPIN) 0.5 MG tablet Take one tab po qhs for anxiety and insomnia 30 tablet 2  ? Omega-3 Fatty Acids (FISH OIL) 1000 MG CAPS  Take 1 capsule by mouth daily.    ? polycarbophil (FIBERCON) 625 MG tablet Take 625 mg by mouth daily.    ? rosuvastatin (CRESTOR) 5 MG tablet Take 1 tablet (5 mg total) by mouth daily. 90 tablet 1  ? ?No current facility-administered medications for this visit.  ? ? ?Allergies as of 07/27/2021 - Review Complete 07/27/2021  ?Allergen Reaction Noted  ? No known allergies  01/22/2021  ? ? ?ROS: ? ?General: Negative for anorexia, weight loss, fever, chills, fatigue, weakness. ?ENT: Negative for hoarseness, difficulty swallowing , nasal congestion. ?CV: Negative for chest pain, angina, palpitations, dyspnea on exertion, peripheral edema.  ?Respiratory: Negative for dyspnea at rest, dyspnea on exertion, cough, sputum, wheezing.  ?GI: See history of present illness. ?GU:  Negative for dysuria, hematuria, urinary incontinence, urinary frequency, nocturnal urination.  ?Endo: Negative for unusual weight change.  ?  ?Physical Examination: ? ? BP 119/79   Pulse 73   Temp 98.7 ?F (37.1 ?C) (Oral)   Wt 151 lb (68.5 kg)   BMI 25.13 kg/m?  ? ?General: Well-nourished, well-developed in no acute distress.  ?Eyes: No icterus. Conjunctivae pink. ?Neuro: Alert and oriented x 3.  Grossly intact. ?Skin: Warm and dry, no jaundice.   ?Psych: Alert and cooperative, normal mood and affect. ? ? ?Imaging Studies: ?No results found. ? ?Assessment and Plan:  ? ?Jasmine Barron is a 66 y.o.  y/o female here to follow up for GERD.Marland Kitchen  Longstanding for over 15 years.  Likely underlying hiatal hernia as cause for reflux with esophagitis and secondary dysphagia.  Doing very well on Prilosec 40 mg once a day.  Discussed about the long-term side effects of PPI therapy including but not limited to thinning of bones, increased risk of infection, chronic kidney disease.  We discussed the use the smallest dose for the shortest.  Of time.  She is willing to go to lower dose of 20 mg a day which I will send a prescription for.  Is unlikely she will be ever be  able to come off a PPI as she had a large hiatal hernia.  We briefly also discussed about surgery to correct the hiatal hernia which she is not interested at this point of time.  Her constipation is doing well on Linzess and we will give a prescription for 90 days with 3 refills.  I also counseled her about lifestyle changes for acid reflux which she is adherent to.  She has failed I Amitiza in the past.  Repeat colonoscopy in 3 years. ? ? ?Dr Jonathon Bellows  MD,MRCP [facility]) ?Follow up in as needed ?

## 2021-08-02 ENCOUNTER — Encounter: Payer: Self-pay | Admitting: Nurse Practitioner

## 2021-09-14 ENCOUNTER — Other Ambulatory Visit: Payer: Self-pay | Admitting: Nurse Practitioner

## 2021-09-14 DIAGNOSIS — F321 Major depressive disorder, single episode, moderate: Secondary | ICD-10-CM

## 2021-09-24 DIAGNOSIS — H43813 Vitreous degeneration, bilateral: Secondary | ICD-10-CM | POA: Diagnosis not present

## 2021-09-24 DIAGNOSIS — Z0100 Encounter for examination of eyes and vision without abnormal findings: Secondary | ICD-10-CM | POA: Diagnosis not present

## 2021-10-09 ENCOUNTER — Other Ambulatory Visit: Payer: Self-pay | Admitting: Nurse Practitioner

## 2021-10-09 DIAGNOSIS — F321 Major depressive disorder, single episode, moderate: Secondary | ICD-10-CM

## 2021-10-15 ENCOUNTER — Ambulatory Visit: Payer: Medicare PPO | Admitting: Dermatology

## 2021-10-15 DIAGNOSIS — D2261 Melanocytic nevi of right upper limb, including shoulder: Secondary | ICD-10-CM | POA: Diagnosis not present

## 2021-10-15 DIAGNOSIS — L814 Other melanin hyperpigmentation: Secondary | ICD-10-CM

## 2021-10-15 DIAGNOSIS — L821 Other seborrheic keratosis: Secondary | ICD-10-CM | POA: Diagnosis not present

## 2021-10-15 DIAGNOSIS — L578 Other skin changes due to chronic exposure to nonionizing radiation: Secondary | ICD-10-CM | POA: Diagnosis not present

## 2021-10-15 NOTE — Patient Instructions (Signed)
Due to recent changes in healthcare laws, you may see results of your pathology and/or laboratory studies on MyChart before the doctors have had a chance to review them. We understand that in some cases there may be results that are confusing or concerning to you. Please understand that not all results are received at the same time and often the doctors may need to interpret multiple results in order to provide you with the best plan of care or course of treatment. Therefore, we ask that you please give us 2 business days to thoroughly review all your results before contacting the office for clarification. Should we see a critical lab result, you will be contacted sooner.   If You Need Anything After Your Visit  If you have any questions or concerns for your doctor, please call our main line at [phone] and press option 4 to reach your doctor's medical assistant. If no one answers, please leave a voicemail as directed and we will return your call as soon as possible. Messages left after 4 pm will be answered the following business day.   You may also send us a message via MyChart. We typically respond to MyChart messages within 1-2 business days.  For prescription refills, please ask your pharmacy to contact our office. Our fax number is [phone].  If you have an urgent issue when the clinic is closed that cannot wait until the next business day, you can page your doctor at the number below.    Please note that while we do our best to be available for urgent issues outside of office hours, we are not available 24/7.   If you have an urgent issue and are unable to reach us, you may choose to seek medical care at your doctor's office, retail clinic, urgent care center, or emergency room.  If you have a medical emergency, please immediately call 911 or go to the emergency department.  Pager Numbers  - Dr. Kowalski: [phone]  - Dr. Moye: [phone]  - Dr. Stewart:  [phone]  In the event of inclement weather, please call our main line at [phone] for an update on the status of any delays or closures.  Dermatology Medication Tips: Please keep the boxes that topical medications come in in order to help keep track of the instructions about where and how to use these. Pharmacies typically print the medication instructions only on the boxes and not directly on the medication tubes.   If your medication is too expensive, please contact our office at [phone] option 4 or send us a message through MyChart.   We are unable to tell what your co-pay for medications will be in advance as this is different depending on your insurance coverage. However, we may be able to find a substitute medication at lower cost or fill out paperwork to get insurance to cover a needed medication.   If a prior authorization is required to get your medication covered by your insurance company, please allow us 1-2 business days to complete this process.  Drug prices often vary depending on where the prescription is filled and some pharmacies may offer cheaper prices.  The website www.goodrx.com contains coupons for medications through different pharmacies. The prices here do not account for what the cost may be with help from insurance (it may be cheaper with your insurance), but the website can give you the price if you did not use any insurance.  - You can print the associated coupon and take it with   your prescription to the pharmacy.  - You may also stop by our office during regular business hours and pick up a GoodRx coupon card.  - If you need your prescription sent electronically to a different pharmacy, notify our office through [place] MyChart or by phone at [phone] option 4.     Si Usted Necesita Algo Despus de Su Visita  Tambin puede enviarnos un mensaje a travs de MyChart. Por lo general respondemos a los mensajes de MyChart en el transcurso de 1 a 2  das hbiles.  Para renovar recetas, por favor pida a su farmacia que se ponga en contacto con nuestra oficina. Nuestro nmero de fax es el [phone].  Si tiene un asunto urgente cuando la clnica est cerrada y que no puede esperar hasta el siguiente da hbil, puede llamar/localizar a su doctor(a) al nmero que aparece a continuacin.   Por favor, tenga en cuenta que aunque hacemos todo lo posible para estar disponibles para asuntos urgentes fuera del horario de oficina, no estamos disponibles las 24 horas del da, los 7 das de la semana.   Si tiene un problema urgente y no puede comunicarse con nosotros, puede optar por buscar atencin mdica  en el consultorio de su doctor(a), en una clnica privada, en un centro de atencin urgente o en una sala de emergencias.  Si tiene una emergencia mdica, por favor llame inmediatamente al 911 o vaya a la sala de emergencias.  Nmeros de bper  - Dr. Kowalski: [phone]  - Dra. Moye: [phone]  - Dra. Stewart: [phone]  En caso de inclemencias del tiempo, por favor llame a nuestra lnea principal al [phone] para una actualizacin sobre el estado de cualquier retraso o cierre.  Consejos para la medicacin en dermatologa: Por favor, guarde las cajas en las que vienen los medicamentos de uso tpico para ayudarle a seguir las instrucciones sobre dnde y cmo usarlos. Las farmacias generalmente imprimen las instrucciones del medicamento slo en las cajas y no directamente en los tubos del medicamento.   Si su medicamento es muy caro, por favor, pngase en contacto con nuestra oficina llamando al [phone] y presione la opcin 4 o envenos un mensaje a travs de MyChart.   No podemos decirle cul ser su copago por los medicamentos por adelantado ya que esto es diferente dependiendo de la cobertura de su seguro. Sin embargo, es posible que podamos encontrar un medicamento sustituto a menor costo o llenar un formulario para que el  seguro cubra el medicamento que se considera necesario.   Si se requiere una autorizacin previa para que su compaa de seguros cubra su medicamento, por favor permtanos de 1 a 2 das hbiles para completar este proceso.  Los precios de los medicamentos varan con frecuencia dependiendo del lugar de dnde se surte la receta y alguna farmacias pueden ofrecer precios ms baratos.  El sitio web www.goodrx.com tiene cupones para medicamentos de diferentes farmacias. Los precios aqu no tienen en cuenta lo que podra costar con la ayuda del seguro (puede ser ms barato con su seguro), pero el sitio web puede darle el precio si no utiliz ningn seguro.  - Puede imprimir el cupn correspondiente y llevarlo con su receta a la farmacia.  - Tambin puede pasar por nuestra oficina durante el horario de atencin regular y recoger una tarjeta de cupones de GoodRx.  - Si necesita que su receta se enve electrnicamente a una farmacia diferente, informe a nuestra oficina a travs de MyChart de    o por telfono llamando al [phone] y presione la opcin 4.  

## 2021-10-15 NOTE — Progress Notes (Signed)
   New Patient Visit  Subjective  Jasmine Barron is a 66 y.o. female who presents for the following: Skin Problem (The patient has spots, moles and lesions to be evaluated, some may be new or changing. ).  The following portions of the chart were reviewed this encounter and updated as appropriate:   Tobacco  Allergies  Meds  Problems  Med Hx  Surg Hx  Fam Hx     Review of Systems:  No other skin or systemic complaints except as noted in HPI or Assessment and Plan.  Objective  Well appearing patient in no apparent distress; mood and affect are within normal limits.  A focused examination was performed including right leg. Relevant physical exam findings are noted in the Assessment and Plan.  right proximal thigh near groin 1.0 x 0.8 cm Stuck-on, waxy, tan-brown papule--Discussed benign etiology and prognosis.     Assessment & Plan  Seborrheic keratosis right proximal thigh near groin  Reassured benign age-related growth.  Recommend observation.  Discussed cryotherapy if spot(s) become irritated or inflamed.   Melanocytic Nevi Right posterior shoulder  - Tan-brown and/or pink-flesh-colored symmetric macules and papules - Benign appearing on exam today - Observation - Call clinic for new or changing moles - Recommend daily use of broad spectrum spf 30+ sunscreen to sun-exposed areas.    Lentigines - Scattered tan macules - Due to sun exposure - Benign-appering, observe - Recommend daily broad spectrum sunscreen SPF 30+ to sun-exposed areas, reapply every 2 hours as needed. - Call for any changes  Actinic Damage - chronic, secondary to cumulative UV radiation exposure/sun exposure over time - diffuse scaly erythematous macules with underlying dyspigmentation - Recommend daily broad spectrum sunscreen SPF 30+ to sun-exposed areas, reapply every 2 hours as needed.  - Recommend staying in the shade or wearing long sleeves, sun glasses (UVA+UVB protection) and wide brim  hats (4-inch brim around the entire circumference of the hat). - Call for new or changing lesions.   Return if symptoms worsen or fail to improve.  IMarye Round, CMA, am acting as scribe for Sarina Ser, MD .  Documentation: I have reviewed the above documentation for accuracy and completeness, and I agree with the above.  Sarina Ser, MD

## 2021-10-21 ENCOUNTER — Encounter: Payer: Self-pay | Admitting: Nurse Practitioner

## 2021-10-21 ENCOUNTER — Ambulatory Visit: Payer: Medicare PPO | Admitting: Nurse Practitioner

## 2021-10-21 VITALS — BP 125/81 | HR 79 | Temp 98.4°F | Resp 16 | Ht 65.0 in | Wt 149.6 lb

## 2021-10-21 DIAGNOSIS — F321 Major depressive disorder, single episode, moderate: Secondary | ICD-10-CM

## 2021-10-21 DIAGNOSIS — E782 Mixed hyperlipidemia: Secondary | ICD-10-CM | POA: Diagnosis not present

## 2021-10-21 DIAGNOSIS — F411 Generalized anxiety disorder: Secondary | ICD-10-CM | POA: Diagnosis not present

## 2021-10-21 DIAGNOSIS — G4709 Other insomnia: Secondary | ICD-10-CM

## 2021-10-21 DIAGNOSIS — K5909 Other constipation: Secondary | ICD-10-CM | POA: Diagnosis not present

## 2021-10-21 MED ORDER — CITALOPRAM HYDROBROMIDE 40 MG PO TABS: 40.0000 mg | ORAL_TABLET | Freq: Every day | ORAL | 3 refills | Status: DC

## 2021-10-21 MED ORDER — CLONAZEPAM 0.5 MG PO TABS: ORAL_TABLET | ORAL | 2 refills | Status: DC

## 2021-10-21 NOTE — Progress Notes (Signed)
[facility] [place], [place] [address]  Internal MEDICINE  Office Visit Note  Patient Name: Jasmine Barron  [id number]  [id number]  Date of Service: 10/21/2021  Chief Complaint  Patient presents with   Follow-up   Hyperlipidemia   Hypertension   Anxiety   Medication Refill    HPI Aneta presents for a follow up visit for medication refills and increased anxiety.  --need refills of clonazepam, wants celexa refill for 90 day supply --increased anxiety but manageable, this change is due to cleaning out parents house recently and still ongoing. Her mother passed away 3 years ago.  Takes linzess for chronic constipation, no issues Tolerating statin therapy for elevated cholesterol and acid reflux is controlled with omeprazole.     Current Medication: Outpatient Encounter Medications as of 10/21/2021  Medication Sig   calcium citrate-vitamin D (CITRACAL+D) 315-200 MG-UNIT tablet Take 1 tablet by mouth 2 (two) times daily.   linaclotide (LINZESS) 290 MCG CAPS capsule Take 1 capsule (290 mcg total) by mouth daily.   Omega-3 Fatty Acids (FISH OIL) 1000 MG CAPS Take 1 capsule by mouth daily.   omeprazole (PRILOSEC) 20 MG capsule Take 1 capsule (20 mg total) by mouth daily.   polycarbophil (FIBERCON) 625 MG tablet Take 625 mg by mouth daily.   rosuvastatin (CRESTOR) 5 MG tablet Take 1 tablet (5 mg total) by mouth daily.   [DISCONTINUED] citalopram (CELEXA) 40 MG tablet Take 1 tablet by mouth once daily   [DISCONTINUED] clonazePAM (KLONOPIN) 0.5 MG tablet Take one tab po qhs for anxiety and insomnia   citalopram (CELEXA) 40 MG tablet Take 1 tablet (40 mg total) by mouth daily.   clonazePAM (KLONOPIN) 0.5 MG tablet Take one tab po qhs for anxiety and insomnia   No facility-administered encounter medications on file as of 10/21/2021.    Surgical History: Past Surgical History:  Procedure Laterality Date   COLONOSCOPY     COLONOSCOPY WITH PROPOFOL N/A 02/04/2021    Procedure: COLONOSCOPY WITH PROPOFOL;  Surgeon: Jonathon Bellows, MD;  Location: [facility] ENDOSCOPY;  Service: Gastroenterology;  Laterality: N/A;   ESOPHAGOGASTRODUODENOSCOPY N/A 02/04/2021   Procedure: ESOPHAGOGASTRODUODENOSCOPY (EGD);  Surgeon: Jonathon Bellows, MD;  Location: [facility] ENDOSCOPY;  Service: Gastroenterology;  Laterality: N/A;   TONSILLECTOMY Bilateral     Medical History: Past Medical History:  Diagnosis Date   Anxiety    High cholesterol    Hypertension     Family History: Family History  Problem Relation Age of Onset   Diabetes Mother    Heart disease Mother    Hypertension Mother    Stroke Father    Diabetes Brother    Diabetes Maternal Grandmother     Social History   Socioeconomic History   Marital status: Divorced    Spouse name: Not on file   Number of children: Not on file   Years of education: Not on file   Highest education level: Not on file  Occupational History   Not on file  Tobacco Use   Smoking status: Never   Smokeless tobacco: Never  Vaping Use   Vaping Use: Never used  Substance and Sexual Activity   Alcohol use: Yes    Comment: 2-3 times a week wine and rum   Drug use: No   Sexual activity: Not on file  Other Topics Concern   Not on file  Social History Narrative   Not on file   Social Determinants of Health   Financial Resource Strain: Not on  file  Food Insecurity: Not on file  Transportation Needs: Not on file  Physical Activity: Not on file  Stress: Not on file  Social Connections: Not on file  Intimate Partner Violence: Not on file      Review of Systems  Constitutional:  Negative for chills, fatigue and unexpected weight change.  HENT:  Negative for congestion, rhinorrhea, sneezing and sore throat.   Eyes:  Negative for redness.  Respiratory: Negative.  Negative for cough, chest tightness, shortness of breath and wheezing.   Cardiovascular: Negative.  Negative for chest pain and palpitations.  Gastrointestinal:  Negative  for abdominal pain, constipation, diarrhea, nausea and vomiting.  Genitourinary:  Negative for dysuria and frequency.  Musculoskeletal:  Negative for arthralgias, back pain, joint swelling and neck pain.  Skin:  Negative for rash.  Neurological: Negative.  Negative for tremors and numbness.  Hematological:  Negative for adenopathy. Does not bruise/bleed easily.  Psychiatric/Behavioral:  Positive for behavioral problems (Depression). Negative for self-injury, sleep disturbance and suicidal ideas. The patient is nervous/anxious.     Vital Signs: BP 125/81   Pulse 79   Temp 98.4 F (36.9 C)   Resp 16   Ht '5\' 5"'$  (1.651 m)   Wt 149 lb 9.6 oz (67.9 kg)   SpO2 98%   BMI 24.89 kg/m    Physical Exam Vitals reviewed.  Constitutional:      General: She is not in acute distress.    Appearance: Normal appearance. She is not ill-appearing.  HENT:     Head: Normocephalic and atraumatic.  Eyes:     Pupils: Pupils are equal, round, and reactive to light.  Cardiovascular:     Rate and Rhythm: Normal rate and regular rhythm.  Pulmonary:     Effort: Pulmonary effort is normal. No respiratory distress.  Neurological:     Mental Status: She is alert and oriented to person, place, and time.  Psychiatric:        Mood and Affect: Mood normal.        Behavior: Behavior normal.        Assessment/Plan: 1. Chronic constipation Takes Linzess as needed, no issues  2. Mixed hyperlipidemia Currently taking rosuvastatin 5 mg daily, no issues  3. Other insomnia Continue clonazepam 0.5 mg at bedtime for anxiety and/or insomnia, refills x3 months, follow-up for additional refills in 3 months - clonazePAM (KLONOPIN) 0.5 MG tablet; Take one tab po qhs for anxiety and insomnia  Dispense: 30 tablet; Refill: 2  4. GAD (generalized anxiety disorder) Celexa and clonazepam refilled  5. Moderate major depression (HCC) Celexa remains effective at current dose, refills ordered - citalopram (CELEXA) 40  MG tablet; Take 1 tablet (40 mg total) by mouth daily.  Dispense: 90 tablet; Refill: 3   General Counseling: Kynslie verbalizes understanding of the findings of todays visit and agrees with plan of treatment. I have discussed any further diagnostic evaluation that may be needed or ordered today. We also reviewed her medications today. she has been encouraged to call the office with any questions or concerns that should arise related to todays visit.    No orders of the defined types were placed in this encounter.   Meds ordered this encounter  Medications   clonazePAM (KLONOPIN) 0.5 MG tablet    Sig: Take one tab po qhs for anxiety and insomnia    Dispense:  30 tablet    Refill:  2   citalopram (CELEXA) 40 MG tablet    Sig: Take 1 tablet (  40 mg total) by mouth daily.    Dispense:  90 tablet    Refill:  3    Please discontinue previous orders for citalopram, patient requesting 90 day supply. For future refills    Return in about 3 months (around 01/21/2022) for F/U, anxiety med refill, Beatryce Colombo PCP.   Total time spent:30 Minutes Time spent includes review of chart, medications, test results, and follow up plan with the patient.   [place] Controlled Substance Database was reviewed by me.  This patient was seen by Jonetta Osgood, FNP-C in collaboration with Dr. Clayborn Bigness as a part of collaborative care agreement.   Keyonda Bickle R. Valetta Fuller, MSN, FNP-C Internal medicine

## 2021-10-24 ENCOUNTER — Encounter: Payer: Self-pay | Admitting: Dermatology

## 2021-11-30 ENCOUNTER — Encounter: Payer: Self-pay | Admitting: Nurse Practitioner

## 2021-12-01 IMAGING — MG DIGITAL SCREENING BILAT W/ TOMO W/ CAD
6 of 10 series · 6 of 30 positions shown · non-contrast
Comparison: Previous exam(s).

CLINICAL DATA: Screening.

EXAM:
DIGITAL SCREENING BILATERAL MAMMOGRAM WITH TOMO AND CAD

[R MLO synth-2D (1 of 2)]
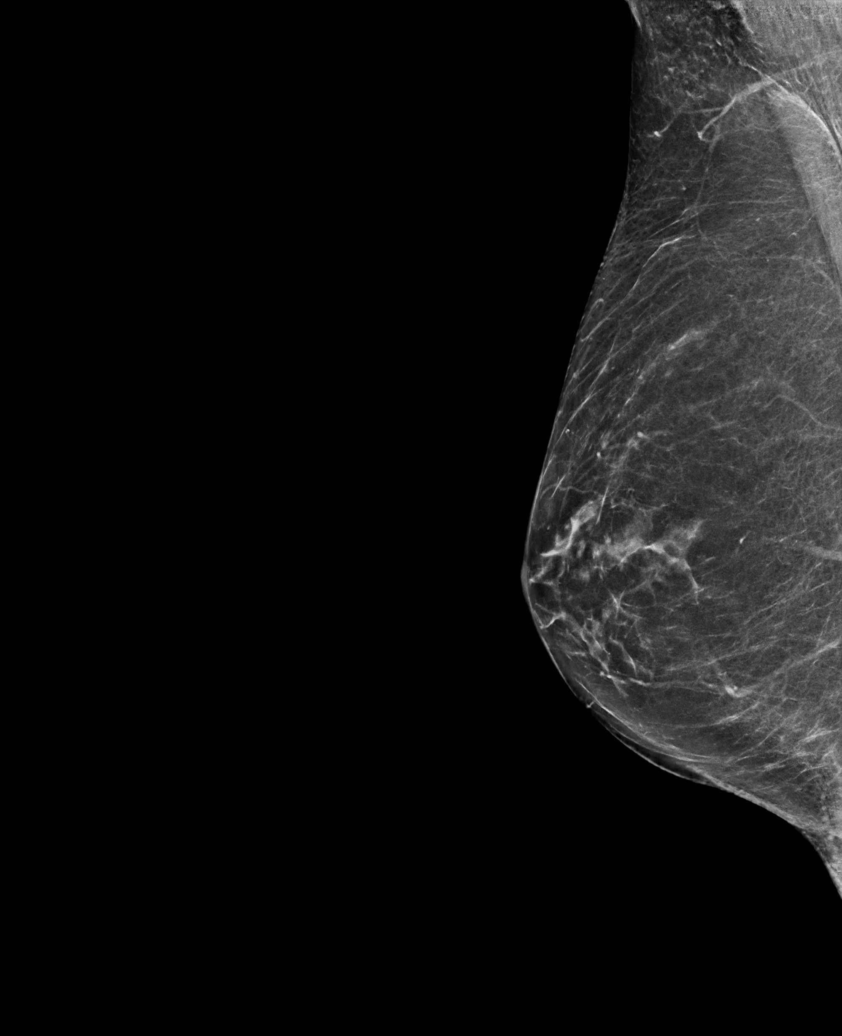

[R CC synth-2D]
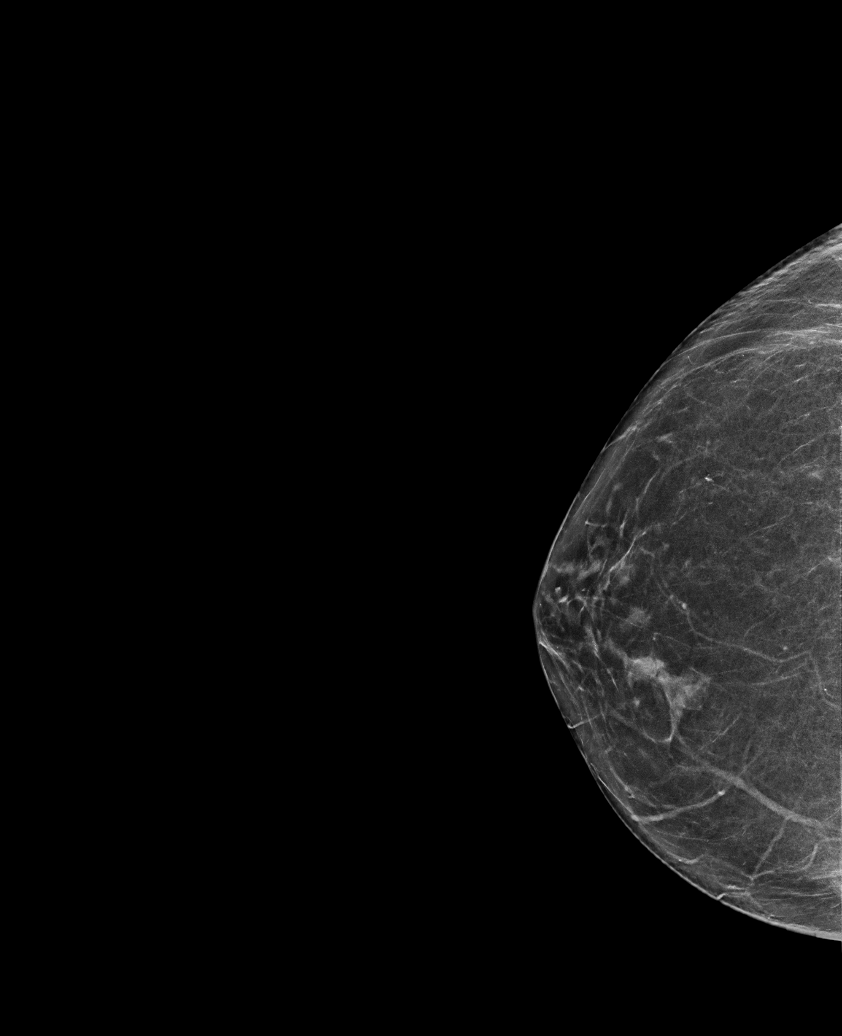

[L MLO synth-2D]
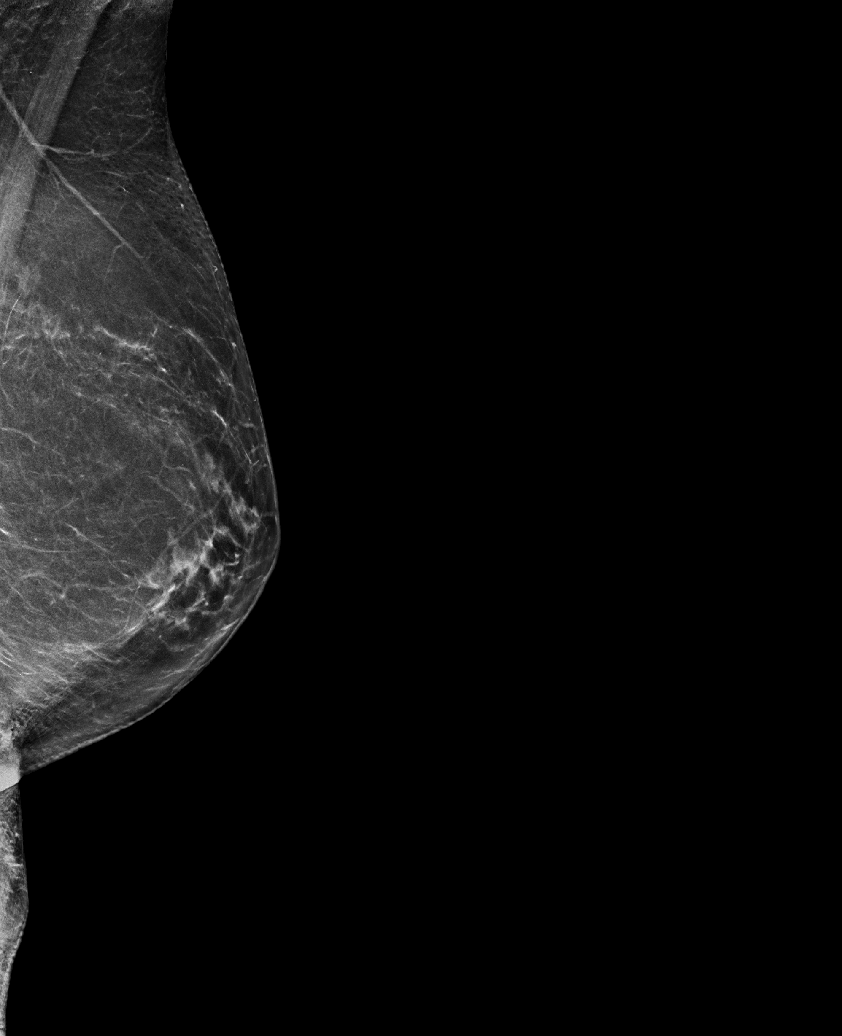

[R MLO synth-2D (2 of 2)]
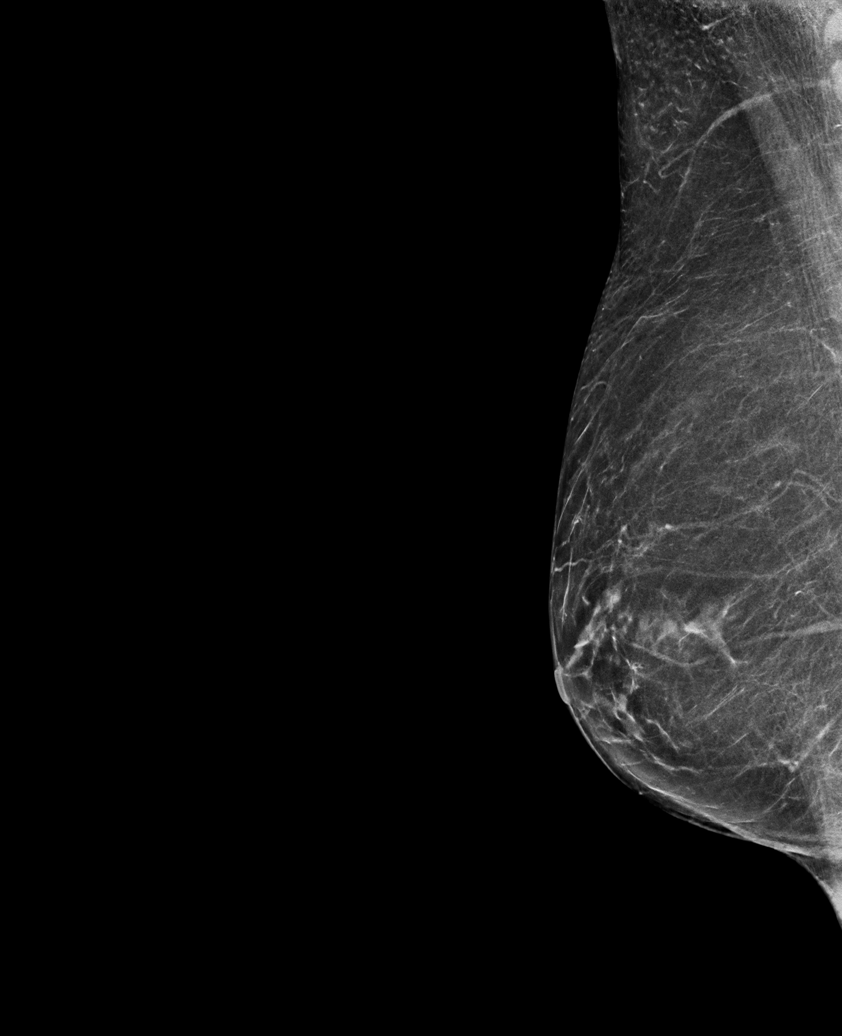

[L CC synth-2D]
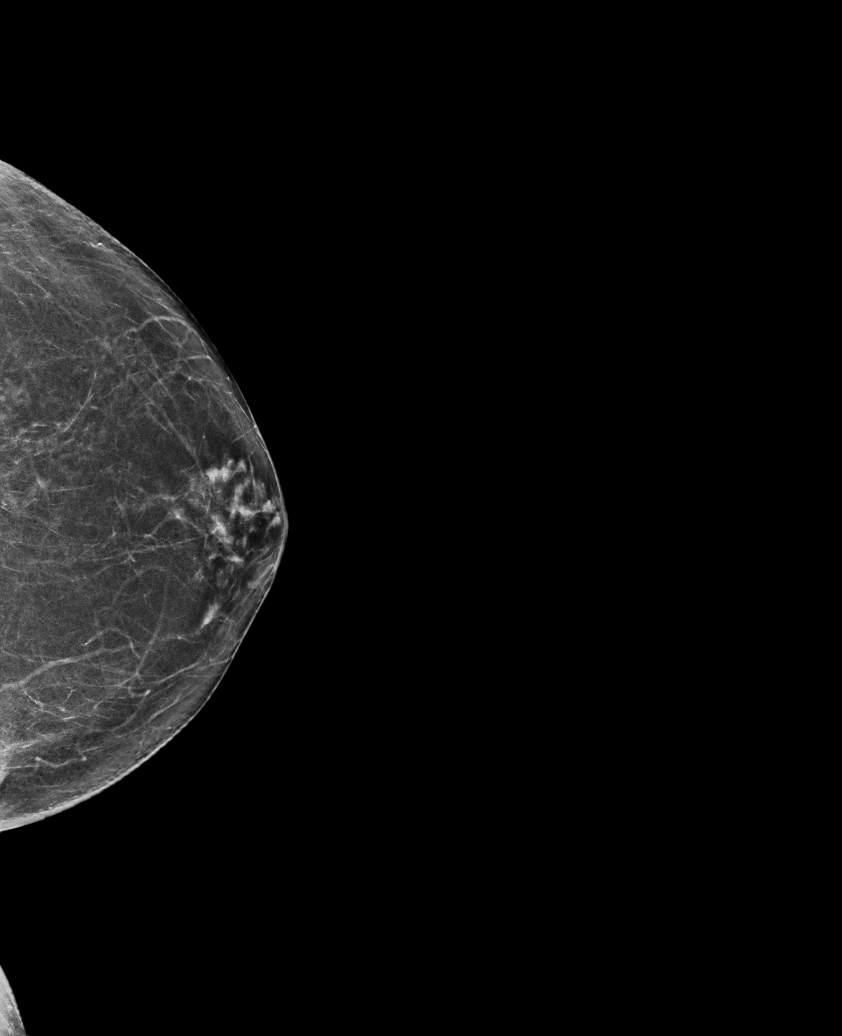

[R MLO tomo · tomo slice 33/66.0]
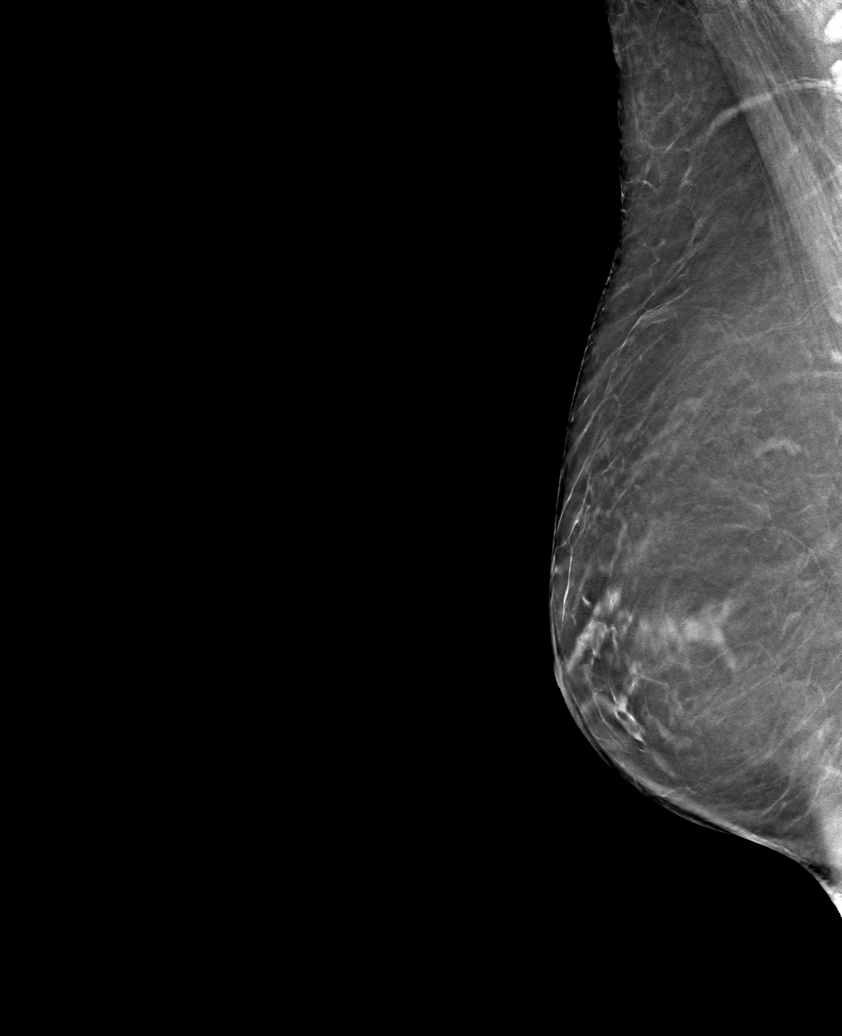

[6 of 30 positions shown; findings below may reference images not displayed]

ACR Breast Density Category b: There are scattered areas of
fibroglandular density.
FINDINGS: There are no findings suspicious for malignancy. Images were
processed with CAD.
IMPRESSION: No mammographic evidence of malignancy. A result letter of this
screening mammogram will be mailed directly to the patient.

RECOMMENDATION:
Screening mammogram in one year. (Code:CN-U-775)

BI-RADS CATEGORY  1: Negative.

## 2022-01-20 ENCOUNTER — Ambulatory Visit: Payer: Medicare PPO | Admitting: Nurse Practitioner

## 2022-01-20 ENCOUNTER — Encounter: Payer: Self-pay | Admitting: Nurse Practitioner

## 2022-01-20 VITALS — BP 132/82 | HR 100 | Temp 96.9°F | Resp 16 | Ht 65.0 in | Wt 150.0 lb

## 2022-01-20 DIAGNOSIS — E782 Mixed hyperlipidemia: Secondary | ICD-10-CM

## 2022-01-20 DIAGNOSIS — K5909 Other constipation: Secondary | ICD-10-CM

## 2022-01-20 DIAGNOSIS — G4709 Other insomnia: Secondary | ICD-10-CM | POA: Diagnosis not present

## 2022-01-20 MED ORDER — CLONAZEPAM 0.5 MG PO TABS: ORAL_TABLET | ORAL | 2 refills | Status: DC

## 2022-01-20 NOTE — Progress Notes (Signed)
[facility] [place], [place] [address]  Internal MEDICINE  Office Visit Note  Patient Name: Jasmine Barron  [id number]  [id number]  Date of Service: 01/20/2022  Chief Complaint  Patient presents with   Follow-up    Anxiety med refill   Hyperlipidemia   Hypertension    HPI [name] presents for a follow-up visit for hypertension, med refills, anxiety Hoarse, feels like she has a cold.  Need refills clonazepam Has January AWV      Current Medication: Outpatient Encounter Medications as of 01/20/2022  Medication Sig   calcium citrate-vitamin D (CITRACAL+D) 315-200 MG-UNIT tablet Take 1 tablet by mouth 2 (two) times daily.   citalopram (CELEXA) 40 MG tablet Take 1 tablet (40 mg total) by mouth daily.   linaclotide (LINZESS) 290 MCG CAPS capsule Take 1 capsule (290 mcg total) by mouth daily.   Omega-3 Fatty Acids (FISH OIL) 1000 MG CAPS Take 1 capsule by mouth daily.   omeprazole (PRILOSEC) 20 MG capsule Take 1 capsule (20 mg total) by mouth daily.   polycarbophil (FIBERCON) 625 MG tablet Take 625 mg by mouth daily.   rosuvastatin (CRESTOR) 5 MG tablet Take 1 tablet (5 mg total) by mouth daily.   [DISCONTINUED] clonazePAM (KLONOPIN) 0.5 MG tablet Take one tab po qhs for anxiety and insomnia   clonazePAM (KLONOPIN) 0.5 MG tablet Take one tab po qhs for anxiety and insomnia   No facility-administered encounter medications on file as of 01/20/2022.    Surgical History: Past Surgical History:  Procedure Laterality Date   COLONOSCOPY     COLONOSCOPY WITH PROPOFOL N/A 02/04/2021   Procedure: COLONOSCOPY WITH PROPOFOL;  Surgeon: Jonathon Bellows, MD;  Location: [facility] ENDOSCOPY;  Service: Gastroenterology;  Laterality: N/A;   ESOPHAGOGASTRODUODENOSCOPY N/A 02/04/2021   Procedure: ESOPHAGOGASTRODUODENOSCOPY (EGD);  Surgeon: Jonathon Bellows, MD;  Location: [facility] ENDOSCOPY;  Service: Gastroenterology;  Laterality: N/A;   TONSILLECTOMY Bilateral     Medical History: Past  Medical History:  Diagnosis Date   Anxiety    High cholesterol    Hypertension     Family History: Family History  Problem Relation Age of Onset   Diabetes Mother    Heart disease Mother    Hypertension Mother    Stroke Father    Diabetes Brother    Diabetes Maternal Grandmother     Social History   Socioeconomic History   Marital status: Divorced    Spouse name: Not on file   Number of children: Not on file   Years of education: Not on file   Highest education level: Not on file  Occupational History   Not on file  Tobacco Use   Smoking status: Never   Smokeless tobacco: Never  Vaping Use   Vaping Use: Never used  Substance and Sexual Activity   Alcohol use: Yes    Comment: 2-3 times a week wine and rum   Drug use: No   Sexual activity: Not on file  Other Topics Concern   Not on file  Social History Narrative   Not on file   Social Determinants of Health   Financial Resource Strain: Not on file  Food Insecurity: Not on file  Transportation Needs: Not on file  Physical Activity: Not on file  Stress: Not on file  Social Connections: Not on file  Intimate Partner Violence: Not on file      Review of Systems  Constitutional:  Negative for chills, fatigue and unexpected weight change.  HENT:  Negative  for congestion, rhinorrhea, sneezing and sore throat.   Eyes:  Negative for redness.  Respiratory: Negative.  Negative for cough, chest tightness, shortness of breath and wheezing.   Cardiovascular: Negative.  Negative for chest pain and palpitations.  Gastrointestinal:  Negative for abdominal pain, constipation, diarrhea, nausea and vomiting.  Genitourinary:  Negative for dysuria and frequency.  Musculoskeletal:  Negative for arthralgias, back pain, joint swelling and neck pain.  Skin:  Negative for rash.  Neurological: Negative.  Negative for tremors and numbness.  Hematological:  Negative for adenopathy. Does not bruise/bleed easily.   Psychiatric/Behavioral:  Positive for behavioral problems (Depression). Negative for self-injury, sleep disturbance and suicidal ideas. The patient is nervous/anxious.     Vital Signs: BP 132/82 Comment: 142/96  Pulse 100   Temp (!) 96.9 F (36.1 C)   Resp 16   Ht '5\' 5"'$  (1.651 m)   Wt 150 lb (68 kg)   SpO2 98%   BMI 24.96 kg/m    Physical Exam Vitals reviewed.  Constitutional:      General: She is not in acute distress.    Appearance: Normal appearance. She is not ill-appearing.  HENT:     Head: Normocephalic and atraumatic.  Eyes:     Pupils: Pupils are equal, round, and reactive to light.  Cardiovascular:     Rate and Rhythm: Normal rate and regular rhythm.  Pulmonary:     Effort: Pulmonary effort is normal. No respiratory distress.  Neurological:     Mental Status: She is alert and oriented to person, place, and time.  Psychiatric:        Mood and Affect: Mood normal.        Behavior: Behavior normal.        Assessment/Plan: 1. Other insomnia Refills ordered. Continue clonazepam as prescribed.  - clonazePAM (KLONOPIN) 0.5 MG tablet; Take one tab po qhs for anxiety and insomnia  Dispense: 30 tablet; Refill: 2  2. Chronic constipation Samples of linzess provided  3. Mixed hyperlipidemia Continue rosuvastatin as prescribed.    General Counseling: callen zuba understanding of the findings of todays visit and agrees with plan of treatment. I have discussed any further diagnostic evaluation that may be needed or ordered today. We also reviewed her medications today. she has been encouraged to call the office with any questions or concerns that should arise related to todays visit.    No orders of the defined types were placed in this encounter.   Meds ordered this encounter  Medications   clonazePAM (KLONOPIN) 0.5 MG tablet    Sig: Take one tab po qhs for anxiety and insomnia    Dispense:  30 tablet    Refill:  2    Return in 3 months (on 04/23/2022)  for previously scheduled, CPE, Renee Erb PCP will also do anx med refill then.   Total time spent:30 Minutes Time spent includes review of chart, medications, test results, and follow up plan with the patient.   [place] by me.  This patient was seen by Jonetta Osgood, FNP-C in collaboration with Dr. Clayborn Bigness as a part of collaborative care agreement.   Teryl Gubler R. Valetta Fuller, MSN, FNP-C Internal medicine

## 2022-02-07 ENCOUNTER — Other Ambulatory Visit: Payer: Self-pay | Admitting: Nurse Practitioner

## 2022-02-07 DIAGNOSIS — E782 Mixed hyperlipidemia: Secondary | ICD-10-CM

## 2022-04-07 ENCOUNTER — Ambulatory Visit: Payer: Medicare PPO | Admitting: Nurse Practitioner

## 2022-04-23 ENCOUNTER — Encounter: Payer: Self-pay | Admitting: Nurse Practitioner

## 2022-04-23 ENCOUNTER — Ambulatory Visit (INDEPENDENT_AMBULATORY_CARE_PROVIDER_SITE_OTHER): Payer: Medicare PPO | Admitting: Nurse Practitioner

## 2022-04-23 VITALS — BP 135/82 | HR 67 | Temp 97.6°F | Resp 16 | Ht 65.0 in | Wt 151.4 lb

## 2022-04-23 DIAGNOSIS — Z0001 Encounter for general adult medical examination with abnormal findings: Secondary | ICD-10-CM | POA: Diagnosis not present

## 2022-04-23 DIAGNOSIS — E782 Mixed hyperlipidemia: Secondary | ICD-10-CM

## 2022-04-23 DIAGNOSIS — G4709 Other insomnia: Secondary | ICD-10-CM

## 2022-04-23 MED ORDER — LINACLOTIDE 290 MCG PO CAPS: 290.0000 ug | ORAL_CAPSULE | Freq: Every day | ORAL | 3 refills | Status: DC

## 2022-04-23 MED ORDER — CLONAZEPAM 0.5 MG PO TABS: ORAL_TABLET | ORAL | 2 refills | Status: DC

## 2022-04-23 MED ORDER — ROSUVASTATIN CALCIUM 5 MG PO TABS: 5.0000 mg | ORAL_TABLET | Freq: Every day | ORAL | 3 refills | Status: DC

## 2022-04-23 NOTE — Progress Notes (Signed)
[facility] [place], [place] [address]  Internal MEDICINE  Office Visit Note  Patient Name: Jasmine Barron  [id number]  [id number]  Date of Service: 04/23/2022  Chief Complaint  Patient presents with   Medicare Wellness   Hyperlipidemia   Hypertension    HPI Jeania presents for an annual well visit and physical exam.  Well-appearing 67 y.o. female with hypertension, depression, high cholesterol and anxiety.  Routine CRC screening: due in 2025 Routine mammogram: received a letter and will schedule .  DEXA scan: done in 2016 Labs: due for routine labs  New or worsening pain: none  Other concerns: none  No significant changes in home life. Now has medicare, can place dermatology referral whenever patient is ready for possible psoriasis rash on ankles      04/23/2022    9:12 AM 02/19/2020   10:24 AM  MMSE - Mini Mental State Exam  Orientation to time 5 5  Orientation to Place 5 5  Registration 3 3  Attention/ Calculation 5 5  Recall 3 3  Language- name 2 objects 2 2  Language- repeat 1 1  Language- follow 3 step command 3 3  Language- read & follow direction 1 1  Write a sentence 1 1  Copy design 1 1  Total score 30 30    Functional Status Survey: Is the patient deaf or have difficulty hearing?: No Does the patient have difficulty seeing, even when wearing glasses/contacts?: No Does the patient have difficulty concentrating, remembering, or making decisions?: No Does the patient have difficulty walking or climbing stairs?: Yes Does the patient have difficulty dressing or bathing?: No Does the patient have difficulty doing errands alone such as visiting a doctor's office or shopping?: No     02/04/2021   12:23 PM 04/22/2021   10:16 AM 07/15/2021   11:21 AM 10/21/2021   10:48 AM 04/23/2022    9:10 AM  Fall Risk  Falls in the past year?  '1 1 1 1  '$ Was there an injury with Fall?  0 0 0 0  Fall Risk Category Calculator  '2  1 1  '$ Fall Risk Category  (Retired)  Moderate  Low   (RETIRED) Patient Fall Risk Level High fall risk Moderate fall risk  Low fall risk   Fall risk Follow up  Falls evaluation completed  Falls evaluation completed Falls evaluation completed       04/23/2022    9:10 AM  Depression screen PHQ 2/9  Decreased Interest 0  Down, Depressed, Hopeless 0  PHQ - 2 Score 0        Current Medication: Outpatient Encounter Medications as of 04/23/2022  Medication Sig   calcium citrate-vitamin D (CITRACAL+D) 315-200 MG-UNIT tablet Take 1 tablet by mouth 2 (two) times daily.   citalopram (CELEXA) 40 MG tablet Take 1 tablet (40 mg total) by mouth daily.   Omega-3 Fatty Acids (FISH OIL) 1000 MG CAPS Take 1 capsule by mouth daily.   omeprazole (PRILOSEC) 20 MG capsule Take 1 capsule (20 mg total) by mouth daily.   polycarbophil (FIBERCON) 625 MG tablet Take 625 mg by mouth daily.   [DISCONTINUED] clonazePAM (KLONOPIN) 0.5 MG tablet Take one tab po qhs for anxiety and insomnia   [DISCONTINUED] linaclotide (LINZESS) 290 MCG CAPS capsule Take 1 capsule (290 mcg total) by mouth daily.   [DISCONTINUED] rosuvastatin (CRESTOR) 5 MG tablet Take 1 tablet by mouth once daily   clonazePAM (KLONOPIN) 0.5 MG tablet Take one tab  po qhs for anxiety and insomnia   linaclotide (LINZESS) 290 MCG CAPS capsule Take 1 capsule (290 mcg total) by mouth daily.   rosuvastatin (CRESTOR) 5 MG tablet Take 1 tablet (5 mg total) by mouth daily.   No facility-administered encounter medications on file as of 04/23/2022.    Surgical History: Past Surgical History:  Procedure Laterality Date   COLONOSCOPY     COLONOSCOPY WITH PROPOFOL N/A 02/04/2021   Procedure: COLONOSCOPY WITH PROPOFOL;  Surgeon: Jonathon Bellows, MD;  Location: [facility] ENDOSCOPY;  Service: Gastroenterology;  Laterality: N/A;   ESOPHAGOGASTRODUODENOSCOPY N/A 02/04/2021   Procedure: ESOPHAGOGASTRODUODENOSCOPY (EGD);  Surgeon: Jonathon Bellows, MD;  Location: [facility] ENDOSCOPY;  Service: Gastroenterology;   Laterality: N/A;   TONSILLECTOMY Bilateral     Medical History: Past Medical History:  Diagnosis Date   Anxiety    High cholesterol    Hypertension     Family History: Family History  Problem Relation Age of Onset   Diabetes Mother    Heart disease Mother    Hypertension Mother    Stroke Father    Diabetes Brother    Diabetes Maternal Grandmother     Social History   Socioeconomic History   Marital status: Divorced    Spouse name: Not on file   Number of children: Not on file   Years of education: Not on file   Highest education level: Not on file  Occupational History   Not on file  Tobacco Use   Smoking status: Never   Smokeless tobacco: Never  Vaping Use   Vaping Use: Never used  Substance and Sexual Activity   Alcohol use: Yes    Comment: 2-3 times a week wine and rum   Drug use: No   Sexual activity: Not on file  Other Topics Concern   Not on file  Social History Narrative   Not on file   Social Determinants of Health   Financial Resource Strain: Not on file  Food Insecurity: Not on file  Transportation Needs: Not on file  Physical Activity: Not on file  Stress: Not on file  Social Connections: Not on file  Intimate Partner Violence: Not on file      Review of Systems  Constitutional:  Negative for activity change, appetite change, chills, fatigue, fever and unexpected weight change.  HENT: Negative.  Negative for congestion, ear pain, rhinorrhea, sore throat and trouble swallowing.   Eyes: Negative.   Respiratory: Negative.  Negative for cough, chest tightness, shortness of breath and wheezing.   Cardiovascular: Negative.  Negative for chest pain.  Gastrointestinal: Negative.  Negative for abdominal pain, blood in stool, constipation, diarrhea, nausea and vomiting.  Endocrine: Negative.   Genitourinary: Negative.  Negative for difficulty urinating, dysuria, frequency, hematuria and urgency.  Musculoskeletal: Negative.  Negative for  arthralgias, back pain, joint swelling, myalgias and neck pain.  Skin: Negative.  Negative for rash and wound.  Allergic/Immunologic: Negative.  Negative for immunocompromised state.  Neurological: Negative.  Negative for dizziness, seizures, numbness and headaches.  Hematological: Negative.   Psychiatric/Behavioral: Negative.  Negative for behavioral problems, self-injury and suicidal ideas. The patient is not nervous/anxious.     Vital Signs: BP 135/82   Pulse 67   Temp 97.6 F (36.4 C)   Resp 16   Ht '5\' 5"'$  (1.651 m)   Wt 151 lb 6.4 oz (68.7 kg)   SpO2 97%   BMI 25.19 kg/m    Physical Exam Vitals reviewed.  Constitutional:      General:  She is awake. She is not in acute distress.    Appearance: Normal appearance. She is well-developed, well-groomed and normal weight. She is not ill-appearing or diaphoretic.  HENT:     Head: Normocephalic and atraumatic.     Right Ear: Tympanic membrane, ear canal and external ear normal.     Left Ear: Tympanic membrane, ear canal and external ear normal.     Nose: Nose normal. No congestion or rhinorrhea.     Mouth/Throat:     Lips: Pink.     Mouth: Mucous membranes are moist.     Pharynx: Oropharynx is clear. Uvula midline. No oropharyngeal exudate or posterior oropharyngeal erythema.  Eyes:     General: Lids are normal. Vision grossly intact. Gaze aligned appropriately. No scleral icterus.       Right eye: No discharge.        Left eye: No discharge.     Extraocular Movements: Extraocular movements intact.     Conjunctiva/sclera: Conjunctivae normal.     Pupils: Pupils are equal, round, and reactive to light.     Funduscopic exam:    Right eye: Red reflex present.        Left eye: Red reflex present. Neck:     Thyroid: No thyromegaly.     Vascular: No JVD.     Trachea: Trachea and phonation normal. No tracheal deviation.  Cardiovascular:     Rate and Rhythm: Normal rate and regular rhythm.     Pulses: Normal pulses.     Heart  sounds: Normal heart sounds, S1 normal and S2 normal. No murmur heard.    No friction rub. No gallop.  Pulmonary:     Effort: Pulmonary effort is normal. No accessory muscle usage or respiratory distress.     Breath sounds: Normal breath sounds and air entry. No stridor. No wheezing or rales.  Chest:     Chest wall: No tenderness.     Comments: Declined breast exam, gets annual mammograms.  Abdominal:     General: Bowel sounds are normal. There is no distension.     Palpations: Abdomen is soft. There is no shifting dullness, fluid wave, mass or pulsatile mass.     Tenderness: There is no abdominal tenderness. There is no guarding or rebound.  Musculoskeletal:        General: No tenderness or deformity. Normal range of motion.     Cervical back: Normal range of motion and neck supple.     Right lower leg: No edema.     Left lower leg: No edema.  Lymphadenopathy:     Cervical: No cervical adenopathy.  Skin:    General: Skin is warm and dry.     Capillary Refill: Capillary refill takes less than 2 seconds.     Coloration: Skin is not pale.     Findings: No erythema or rash.  Neurological:     Mental Status: She is alert and oriented to person, place, and time.     Cranial Nerves: No cranial nerve deficit.     Motor: No abnormal muscle tone.     Coordination: Coordination normal.     Gait: Gait normal.     Deep Tendon Reflexes: Reflexes are normal and symmetric.  Psychiatric:        Mood and Affect: Mood normal.        Behavior: Behavior normal. Behavior is cooperative.        Thought Content: Thought content normal.  Judgment: Judgment normal.        Assessment/Plan: 1. Encounter for general adult medical examination with abnormal findings Age-appropriate preventive screenings and vaccinations discussed, annual physical exam completed. Routine labs for health maintenance ordered, see below. PHM updated.  - CBC with Differential/Platelet - CMP14+EGFR - Lipid  Profile - rosuvastatin (CRESTOR) 5 MG tablet; Take 1 tablet (5 mg total) by mouth daily.  Dispense: 90 tablet; Refill: 3 - linaclotide (LINZESS) 290 MCG CAPS capsule; Take 1 capsule (290 mcg total) by mouth daily.  Dispense: 90 capsule; Refill: 3  2. Other insomnia Continue klonopin as prescribed. Routine labs ordered - CBC with Differential/Platelet - CMP14+EGFR - Lipid Profile - clonazePAM (KLONOPIN) 0.5 MG tablet; Take one tab po qhs for anxiety and insomnia  Dispense: 30 tablet; Refill: 2  3. Mixed hyperlipidemia Routine labs ordered, continue rosuvastatin as prescribed.  - CBC with Differential/Platelet - CMP14+EGFR - Lipid Profile - rosuvastatin (CRESTOR) 5 MG tablet; Take 1 tablet (5 mg total) by mouth daily.  Dispense: 90 tablet; Refill: 3      General Counseling: Lanelle verbalizes understanding of the findings of todays visit and agrees with plan of treatment. I have discussed any further diagnostic evaluation that may be needed or ordered today. We also reviewed her medications today. she has been encouraged to call the office with any questions or concerns that should arise related to todays visit.    Orders Placed This Encounter  Procedures   CBC with Differential/Platelet   CMP14+EGFR   Lipid Profile    Meds ordered this encounter  Medications   clonazePAM (KLONOPIN) 0.5 MG tablet    Sig: Take one tab po qhs for anxiety and insomnia    Dispense:  30 tablet    Refill:  2   rosuvastatin (CRESTOR) 5 MG tablet    Sig: Take 1 tablet (5 mg total) by mouth daily.    Dispense:  90 tablet    Refill:  3   linaclotide (LINZESS) 290 MCG CAPS capsule    Sig: Take 1 capsule (290 mcg total) by mouth daily.    Dispense:  90 capsule    Refill:  3    Return in about 3 months (around 07/14/2022) for F/U, anxiety med refill, Yonah Tangeman PCP.   Total time spent:30 Minutes Time spent includes review of chart, medications, test results, and follow up plan with the patient.   [place]  Controlled Substance Database was reviewed by me.  This patient was seen by Jonetta Osgood, FNP-C in collaboration with Dr. Clayborn Bigness as a part of collaborative care agreement.  Zyrion Coey R. Valetta Fuller, MSN, FNP-C Internal medicine

## 2022-06-09 DIAGNOSIS — H7292 Unspecified perforation of tympanic membrane, left ear: Secondary | ICD-10-CM | POA: Diagnosis not present

## 2022-06-16 DIAGNOSIS — H6692 Otitis media, unspecified, left ear: Secondary | ICD-10-CM | POA: Diagnosis not present

## 2022-07-21 ENCOUNTER — Encounter: Payer: Self-pay | Admitting: Nurse Practitioner

## 2022-07-21 ENCOUNTER — Ambulatory Visit: Payer: Medicare PPO | Admitting: Nurse Practitioner

## 2022-07-21 VITALS — BP 108/81 | HR 83 | Temp 97.5°F | Resp 16 | Ht 65.0 in | Wt 150.2 lb

## 2022-07-21 DIAGNOSIS — K5909 Other constipation: Secondary | ICD-10-CM | POA: Diagnosis not present

## 2022-07-21 DIAGNOSIS — G4709 Other insomnia: Secondary | ICD-10-CM

## 2022-07-21 DIAGNOSIS — K219 Gastro-esophageal reflux disease without esophagitis: Secondary | ICD-10-CM

## 2022-07-21 MED ORDER — CLONAZEPAM 0.5 MG PO TABS: ORAL_TABLET | ORAL | 2 refills | Status: DC

## 2022-07-21 MED ORDER — OMEPRAZOLE 20 MG PO CPDR: 20.0000 mg | DELAYED_RELEASE_CAPSULE | Freq: Every day | ORAL | 3 refills | Status: DC

## 2022-07-21 NOTE — Progress Notes (Signed)
[facility] [address] [place], [place] [address]  Internal MEDICINE  Office Visit Note  Patient Name: Jasmine Barron  [id number]  [id number]  Date of Service: 07/21/2022  Chief Complaint  Patient presents with   Hypertension   Hyperlipidemia   Follow-up    Med refill.     HPI [name] presents for a follow-up visit for medication refills.  Left ear drum ruptured and is worried it is not healed completely. Has been having dizziness/vertigo. Was seen by urgent care Insomnia -- takes clonazepam for sleep. Remains effective, no issues. Due for refills GERD -- takes omeprazole 20 mg daily. If prescription capsule does not help, she will take OTC tablet omeprazole which works well for her    Current Medication: Outpatient Encounter Medications as of 07/21/2022  Medication Sig   calcium citrate-vitamin D (CITRACAL+D) 315-200 MG-UNIT tablet Take 1 tablet by mouth 2 (two) times daily.   citalopram (CELEXA) 40 MG tablet Take 1 tablet (40 mg total) by mouth daily.   linaclotide (LINZESS) 290 MCG CAPS capsule Take 1 capsule (290 mcg total) by mouth daily.   Omega-3 Fatty Acids (FISH OIL) 1000 MG CAPS Take 1 capsule by mouth daily.   rosuvastatin (CRESTOR) 5 MG tablet Take 1 tablet (5 mg total) by mouth daily.   [DISCONTINUED] clonazePAM (KLONOPIN) 0.5 MG tablet Take one tab po qhs for anxiety and insomnia   [DISCONTINUED] omeprazole (PRILOSEC) 20 MG capsule Take 1 capsule (20 mg total) by mouth daily.   [DISCONTINUED] polycarbophil (FIBERCON) 625 MG tablet Take 625 mg by mouth daily.   clonazePAM (KLONOPIN) 0.5 MG tablet Take one tab po qhs for anxiety and insomnia   omeprazole (PRILOSEC) 20 MG capsule Take 1 capsule (20 mg total) by mouth daily.   No facility-administered encounter medications on file as of 07/21/2022.    Surgical History: Past Surgical History:  Procedure Laterality Date   COLONOSCOPY     COLONOSCOPY WITH PROPOFOL N/A 02/04/2021   Procedure: COLONOSCOPY  WITH PROPOFOL;  Surgeon: Wyline Mood, MD;  Location: [facility] ENDOSCOPY;  Service: Gastroenterology;  Laterality: N/A;   ESOPHAGOGASTRODUODENOSCOPY N/A 02/04/2021   Procedure: ESOPHAGOGASTRODUODENOSCOPY (EGD);  Surgeon: Wyline Mood, MD;  Location: [facility] ENDOSCOPY;  Service: Gastroenterology;  Laterality: N/A;   TONSILLECTOMY Bilateral     Medical History: Past Medical History:  Diagnosis Date   Anxiety    High cholesterol    Hypertension     Family History: Family History  Problem Relation Age of Onset   Diabetes Mother    Heart disease Mother    Hypertension Mother    Stroke Father    Diabetes Brother    Diabetes Maternal Grandmother     Social History   Socioeconomic History   Marital status: Divorced    Spouse name: Not on file   Number of children: Not on file   Years of education: Not on file   Highest education level: Not on file  Occupational History   Not on file  Tobacco Use   Smoking status: Never   Smokeless tobacco: Never  Vaping Use   Vaping Use: Never used  Substance and Sexual Activity   Alcohol use: Yes    Comment: 2-3 times a week wine and rum   Drug use: No   Sexual activity: Not on file  Other Topics Concern   Not on file  Social History Narrative   Not on file   Social Determinants of Health   Financial Resource Strain: Not on file  Food Insecurity: Not on file  Transportation Needs: Not on file  Physical Activity: Not on file  Stress: Not on file  Social Connections: Not on file  Intimate Partner Violence: Not on file      Review of Systems  Constitutional:  Negative for chills, fatigue and unexpected weight change.  HENT:  Negative for congestion, rhinorrhea, sneezing and sore throat.   Eyes:  Negative for redness.  Respiratory: Negative.  Negative for cough, chest tightness, shortness of breath and wheezing.   Cardiovascular: Negative.  Negative for chest pain and palpitations.  Gastrointestinal:  Negative for abdominal pain,  constipation, diarrhea, nausea and vomiting.  Genitourinary:  Negative for dysuria and frequency.  Musculoskeletal:  Negative for arthralgias, back pain, joint swelling and neck pain.  Skin:  Negative for rash.  Neurological: Negative.  Negative for tremors and numbness.  Hematological:  Negative for adenopathy. Does not bruise/bleed easily.  Psychiatric/Behavioral:  Positive for behavioral problems (Depression). Negative for self-injury, sleep disturbance and suicidal ideas. The patient is nervous/anxious.     Vital Signs: BP 108/81   Pulse 83   Temp (!) 97.5 F (36.4 C)   Resp 16   Ht  (1.651 m)   Wt 150 lb 3.2 oz (68.1 kg)   SpO2 99%   BMI 24.99 kg/m    Physical Exam Vitals reviewed.  Constitutional:      General: She is not in acute distress.    Appearance: Normal appearance. She is not ill-appearing.  HENT:     Head: Normocephalic and atraumatic.  Eyes:     Pupils: Pupils are equal, round, and reactive to light.  Cardiovascular:     Rate and Rhythm: Normal rate and regular rhythm.  Pulmonary:     Effort: Pulmonary effort is normal. No respiratory distress.  Neurological:     Mental Status: She is alert and oriented to person, place, and time.  Psychiatric:        Mood and Affect: Mood normal.        Behavior: Behavior normal.        Assessment/Plan: 1. Chronic constipation Continue linzess as prescribed.   2. GERD without esophagitis Continue omeprazole as prescribed.  - omeprazole (PRILOSEC) 20 MG capsule; Take 1 capsule (20 mg total) by mouth daily.  Dispense: 90 capsule; Refill: 3  3. Other insomnia Continue clonazepam as prescribed  - clonazePAM (KLONOPIN) 0.5 MG tablet; Take one tab po qhs for anxiety and insomnia  Dispense: 30 tablet; Refill: 2   General Counseling: Akiva verbalizes understanding of the findings of todays visit and agrees with plan of treatment. I have discussed any further diagnostic evaluation that may be needed or ordered  today. We also reviewed her medications today. she has been encouraged to call the office with any questions or concerns that should arise related to todays visit.    No orders of the defined types were placed in this encounter.   Meds ordered this encounter  Medications   clonazePAM (KLONOPIN) 0.5 MG tablet    Sig: Take one tab po qhs for anxiety and insomnia    Dispense:  30 tablet    Refill:  2   omeprazole (PRILOSEC) 20 MG capsule    Sig: Take 1 capsule (20 mg total) by mouth daily.    Dispense:  90 capsule    Refill:  3    Return in about 3 months (around 10/12/2022) for F/U, med refill, Mariyah Upshaw PCP clonazepam.   Total time spent:30 Minutes Time  spent includes review of chart, medications, test results, and follow up plan with the patient.   [place] Controlled Substance Database was reviewed by me.  This patient was seen by Jonetta Osgood, FNP-C in collaboration with Dr. Clayborn Bigness as a part of collaborative care agreement.   Johm Pfannenstiel R. Valetta Fuller, MSN, FNP-C Internal medicine

## 2022-09-15 DIAGNOSIS — E782 Mixed hyperlipidemia: Secondary | ICD-10-CM | POA: Diagnosis not present

## 2022-09-15 DIAGNOSIS — Z1231 Encounter for screening mammogram for malignant neoplasm of breast: Secondary | ICD-10-CM | POA: Diagnosis not present

## 2022-09-15 DIAGNOSIS — G4709 Other insomnia: Secondary | ICD-10-CM | POA: Diagnosis not present

## 2022-09-15 DIAGNOSIS — Z0001 Encounter for general adult medical examination with abnormal findings: Secondary | ICD-10-CM | POA: Diagnosis not present

## 2022-09-16 LAB — CMP14+EGFR
ALT: 19 IU/L (ref 0–32)
AST: 19 IU/L (ref 0–40)
Albumin: 4.8 g/dL (ref 3.9–4.9)
Alkaline Phosphatase: 68 IU/L (ref 44–121)
BUN/Creatinine Ratio: 36 — ABNORMAL HIGH (ref 12–28)
BUN: 21 mg/dL (ref 8–27)
Bilirubin Total: 0.3 mg/dL (ref 0.0–1.2)
CO2: 24 mmol/L (ref 20–29)
Calcium: 10.2 mg/dL (ref 8.7–10.3)
Chloride: 102 mmol/L (ref 96–106)
Creatinine, Ser: 0.58 mg/dL (ref 0.57–1.00)
Globulin, Total: 2.7 g/dL (ref 1.5–4.5)
Glucose: 102 mg/dL — ABNORMAL HIGH (ref 70–99)
Potassium: 4.6 mmol/L (ref 3.5–5.2)
Sodium: 140 mmol/L (ref 134–144)
Total Protein: 7.5 g/dL (ref 6.0–8.5)
eGFR: 98 mL/min/{1.73_m2} (ref >59)

## 2022-09-16 LAB — CBC WITH DIFFERENTIAL/PLATELET
Basophils Absolute: 0 10*3/uL (ref 0.0–0.2)
Basos: 0 %
EOS (ABSOLUTE): 0 10*3/uL (ref 0.0–0.4)
Eos: 1 %
Hematocrit: 41 % (ref 34.0–46.6)
Hemoglobin: 13.5 g/dL (ref 11.1–15.9)
Immature Grans (Abs): 0 10*3/uL (ref 0.0–0.1)
Immature Granulocytes: 0 %
Lymphocytes Absolute: 1.9 10*3/uL (ref 0.7–3.1)
Lymphs: 41 %
MCH: 28.9 pg (ref 26.6–33.0)
MCHC: 32.9 g/dL (ref 31.5–35.7)
MCV: 88 fL (ref 79–97)
Monocytes Absolute: 0.6 10*3/uL (ref 0.1–0.9)
Monocytes: 12 %
Neutrophils Absolute: 2.1 10*3/uL (ref 1.4–7.0)
Neutrophils: 46 %
Platelets: 278 10*3/uL (ref 150–450)
RBC: 4.67 x10E6/uL (ref 3.77–5.28)
RDW: 13.3 % (ref 11.7–15.4)
WBC: 4.6 10*3/uL (ref 3.4–10.8)

## 2022-09-16 LAB — LIPID PANEL
Chol/HDL Ratio: 3.2 ratio (ref 0.0–4.4)
Cholesterol, Total: 204 mg/dL — ABNORMAL HIGH (ref 100–199)
HDL: 64 mg/dL (ref >39)
LDL Chol Calc (NIH): 114 mg/dL — ABNORMAL HIGH (ref 0–99)
Triglycerides: 148 mg/dL (ref 0–149)
VLDL Cholesterol Cal: 26 mg/dL (ref 5–40)

## 2022-09-27 DIAGNOSIS — H43813 Vitreous degeneration, bilateral: Secondary | ICD-10-CM | POA: Diagnosis not present

## 2022-09-27 DIAGNOSIS — H2513 Age-related nuclear cataract, bilateral: Secondary | ICD-10-CM | POA: Diagnosis not present

## 2022-09-27 DIAGNOSIS — Z0100 Encounter for examination of eyes and vision without abnormal findings: Secondary | ICD-10-CM | POA: Diagnosis not present

## 2022-09-27 DIAGNOSIS — H5231 Anisometropia: Secondary | ICD-10-CM | POA: Diagnosis not present

## 2022-09-27 DIAGNOSIS — H35371 Puckering of macula, right eye: Secondary | ICD-10-CM | POA: Diagnosis not present

## 2022-10-12 ENCOUNTER — Encounter: Payer: Self-pay | Admitting: Nurse Practitioner

## 2022-10-12 ENCOUNTER — Ambulatory Visit: Payer: Medicare PPO | Admitting: Nurse Practitioner

## 2022-10-12 VITALS — BP 130/84 | HR 64 | Temp 98.4°F | Resp 16 | Ht 65.0 in | Wt 150.2 lb

## 2022-10-12 DIAGNOSIS — K5909 Other constipation: Secondary | ICD-10-CM

## 2022-10-12 DIAGNOSIS — K219 Gastro-esophageal reflux disease without esophagitis: Secondary | ICD-10-CM | POA: Diagnosis not present

## 2022-10-12 DIAGNOSIS — E782 Mixed hyperlipidemia: Secondary | ICD-10-CM

## 2022-10-12 DIAGNOSIS — Z79899 Other long term (current) drug therapy: Secondary | ICD-10-CM | POA: Diagnosis not present

## 2022-10-12 DIAGNOSIS — G4709 Other insomnia: Secondary | ICD-10-CM

## 2022-10-12 DIAGNOSIS — F321 Major depressive disorder, single episode, moderate: Secondary | ICD-10-CM | POA: Diagnosis not present

## 2022-10-12 DIAGNOSIS — F411 Generalized anxiety disorder: Secondary | ICD-10-CM | POA: Diagnosis not present

## 2022-10-12 LAB — POCT URINE DRUG SCREEN
Methylenedioxyamphetamine: NOT DETECTED
POC Amphetamine UR: NOT DETECTED
POC BENZODIAZEPINES UR: POSITIVE — AB
POC Barbiturate UR: NOT DETECTED
POC Cocaine UR: NOT DETECTED
POC Ecstasy UR: NOT DETECTED
POC Marijuana UR: NOT DETECTED
POC Methadone UR: NOT DETECTED
POC Methamphetamine UR: NOT DETECTED
POC Opiate Ur: NOT DETECTED
POC Oxycodone UR: NOT DETECTED
POC PHENCYCLIDINE UR: NOT DETECTED
POC TRICYCLICS UR: NOT DETECTED

## 2022-10-12 MED ORDER — CLONAZEPAM 0.5 MG PO TABS: ORAL_TABLET | ORAL | 2 refills | Status: DC

## 2022-10-12 MED ORDER — CITALOPRAM HYDROBROMIDE 40 MG PO TABS: 40.0000 mg | ORAL_TABLET | Freq: Every day | ORAL | 3 refills | Status: DC

## 2022-10-12 NOTE — Progress Notes (Signed)
[facility] [address] [place], [place] [address]  Internal MEDICINE  Office Visit Note  Patient Name: Jasmine Barron  [id number]  [id number]  Date of Service: 10/12/2022  Chief Complaint  Patient presents with   Follow-up   Anxiety   Hypertension    HPI Toshia presents for a follow-up visit for high cholesterol, insomnia, anxiety, UDS.  High cholesterol -- taking rosuvastatin.  Insomnia -- doing well, clonazepam remains effective, due for refills  Need refills of citalopram -- helps with her anxiety Due for UDS  GERD -- taking omeprazole Constipation -- takes linzess    Current Medication: Outpatient Encounter Medications as of 10/12/2022  Medication Sig   calcium citrate-vitamin D (CITRACAL+D) 315-200 MG-UNIT tablet Take 1 tablet by mouth 2 (two) times daily.   linaclotide (LINZESS) 290 MCG CAPS capsule Take 1 capsule (290 mcg total) by mouth daily.   Omega-3 Fatty Acids (FISH OIL) 1000 MG CAPS Take 1 capsule by mouth daily.   omeprazole (PRILOSEC) 20 MG capsule Take 1 capsule (20 mg total) by mouth daily.   rosuvastatin (CRESTOR) 5 MG tablet Take 1 tablet (5 mg total) by mouth daily.   [DISCONTINUED] citalopram (CELEXA) 40 MG tablet Take 1 tablet (40 mg total) by mouth daily.   [DISCONTINUED] clonazePAM (KLONOPIN) 0.5 MG tablet Take one tab po qhs for anxiety and insomnia   citalopram (CELEXA) 40 MG tablet Take 1 tablet (40 mg total) by mouth daily.   clonazePAM (KLONOPIN) 0.5 MG tablet Take one tab po qhs for anxiety and insomnia   No facility-administered encounter medications on file as of 10/12/2022.    Surgical History: Past Surgical History:  Procedure Laterality Date   COLONOSCOPY     COLONOSCOPY WITH PROPOFOL N/A 02/04/2021   Procedure: COLONOSCOPY WITH PROPOFOL;  Surgeon: Wyline Mood, MD;  Location: [facility] ENDOSCOPY;  Service: Gastroenterology;  Laterality: N/A;   ESOPHAGOGASTRODUODENOSCOPY N/A 02/04/2021   Procedure: ESOPHAGOGASTRODUODENOSCOPY (EGD);   Surgeon: Wyline Mood, MD;  Location: [facility] ENDOSCOPY;  Service: Gastroenterology;  Laterality: N/A;   TONSILLECTOMY Bilateral     Medical History: Past Medical History:  Diagnosis Date   Anxiety    High cholesterol    Hypertension     Family History: Family History  Problem Relation Age of Onset   Diabetes Mother    Heart disease Mother    Hypertension Mother    Stroke Father    Diabetes Brother    Diabetes Maternal Grandmother     Social History   Socioeconomic History   Marital status: Divorced    Spouse name: Not on file   Number of children: Not on file   Years of education: Not on file   Highest education level: Not on file  Occupational History   Not on file  Tobacco Use   Smoking status: Never   Smokeless tobacco: Never  Vaping Use   Vaping status: Never Used  Substance and Sexual Activity   Alcohol use: Yes    Comment: 2-3 times a week wine and rum   Drug use: No   Sexual activity: Not on file  Other Topics Concern   Not on file  Social History Narrative   Not on file   Social Determinants of Health   Financial Resource Strain: Not on file  Food Insecurity: Not on file  Transportation Needs: Not on file  Physical Activity: Not on file  Stress: Not on file  Social Connections: Not on file  Intimate Partner Violence: Not on file  Review of Systems  Constitutional:  Negative for chills, fatigue and unexpected weight change.  HENT:  Negative for congestion, rhinorrhea, sneezing and sore throat.   Eyes:  Negative for redness.  Respiratory: Negative.  Negative for cough, chest tightness, shortness of breath and wheezing.   Cardiovascular: Negative.  Negative for chest pain and palpitations.  Gastrointestinal:  Negative for abdominal pain, constipation, diarrhea, nausea and vomiting.  Genitourinary:  Negative for dysuria and frequency.  Musculoskeletal:  Negative for arthralgias, back pain, joint swelling and neck pain.  Skin:  Negative for  rash.  Neurological: Negative.  Negative for tremors and numbness.  Hematological:  Negative for adenopathy. Does not bruise/bleed easily.  Psychiatric/Behavioral:  Positive for behavioral problems (Depression). Negative for self-injury, sleep disturbance and suicidal ideas. The patient is nervous/anxious.     Vital Signs: BP 130/84   Pulse 64   Temp 98.4 F (36.9 C)   Resp 16   Ht 5\' 5"  (1.651 m)   Wt 150 lb 3.2 oz (68.1 kg)   SpO2 96%   BMI 24.99 kg/m    Physical Exam Vitals reviewed.  Constitutional:      General: She is not in acute distress.    Appearance: Normal appearance. She is not ill-appearing.  HENT:     Head: Normocephalic and atraumatic.  Eyes:     Pupils: Pupils are equal, round, and reactive to light.  Cardiovascular:     Rate and Rhythm: Normal rate and regular rhythm.  Pulmonary:     Effort: Pulmonary effort is normal. No respiratory distress.  Neurological:     Mental Status: She is alert and oriented to person, place, and time.  Psychiatric:        Mood and Affect: Mood normal.        Behavior: Behavior normal.        Assessment/Plan: 1. Mixed hyperlipidemia Continue rosuvastatin as prescribed.   2. Chronic constipation Continue linzess as prescribed   3. GERD without esophagitis Continue omeprazole as prescribed   4. Other insomnia Continue clonazepam as prescribed, follow up in 3 months for additional refills  - clonazePAM (KLONOPIN) 0.5 MG tablet; Take one tab po qhs for anxiety and insomnia  Dispense: 30 tablet; Refill: 2  5. Encounter for long-term (current) use of medications UDS done today, positive for benzo which is consistent with current prescriptions.  - POCT Urine Drug Screen  6. GAD (generalized anxiety disorder) Continue citalopram as prescribed.  - citalopram (CELEXA) 40 MG tablet; Take 1 tablet (40 mg total) by mouth daily.  Dispense: 90 tablet; Refill: 3  7. Moderate major depression (HCC) Continue citalopram as  prescribed.  - citalopram (CELEXA) 40 MG tablet; Take 1 tablet (40 mg total) by mouth daily.  Dispense: 90 tablet; Refill: 3   General Counseling: Oscar verbalizes understanding of the findings of todays visit and agrees with plan of treatment. I have discussed any further diagnostic evaluation that may be needed or ordered today. We also reviewed her medications today. she has been encouraged to call the office with any questions or concerns that should arise related to todays visit.    Orders Placed This Encounter  Procedures   POCT Urine Drug Screen    Meds ordered this encounter  Medications   clonazePAM (KLONOPIN) 0.5 MG tablet    Sig: Take one tab po qhs for anxiety and insomnia    Dispense:  30 tablet    Refill:  2   citalopram (CELEXA) 40 MG tablet  Sig: Take 1 tablet (40 mg total) by mouth daily.    Dispense:  90 tablet    Refill:  3    Please discontinue previous orders for citalopram, patient requesting 90 day supply. For future refills    Return in about 3 months (around 01/05/2023) for F/U, med refill, Eleuterio Dollar PCP clonazepam .   Total time spent:30 Minutes Time spent includes review of chart, medications, test results, and follow up plan with the patient.   [place] Controlled Substance Database was reviewed by me.  This patient was seen by Sallyanne Kuster, FNP-C in collaboration with Dr. Beverely Risen as a part of collaborative care agreement.   Tashay Bozich R. Tedd Sias, MSN, FNP-C Internal medicine

## 2023-01-03 ENCOUNTER — Ambulatory Visit: Payer: Medicare PPO | Admitting: Nurse Practitioner

## 2023-01-03 ENCOUNTER — Encounter: Payer: Self-pay | Admitting: Nurse Practitioner

## 2023-01-03 VITALS — BP 134/88 | HR 66 | Temp 98.1°F | Resp 16 | Ht 65.0 in | Wt 150.0 lb

## 2023-01-03 DIAGNOSIS — F411 Generalized anxiety disorder: Secondary | ICD-10-CM | POA: Diagnosis not present

## 2023-01-03 DIAGNOSIS — E782 Mixed hyperlipidemia: Secondary | ICD-10-CM

## 2023-01-03 DIAGNOSIS — G4709 Other insomnia: Secondary | ICD-10-CM

## 2023-01-03 DIAGNOSIS — K219 Gastro-esophageal reflux disease without esophagitis: Secondary | ICD-10-CM

## 2023-01-03 MED ORDER — CLONAZEPAM 0.5 MG PO TABS: ORAL_TABLET | ORAL | 2 refills | Status: DC

## 2023-01-03 NOTE — Progress Notes (Signed)
[facility] [address] [place], [place] [address]  Internal MEDICINE  Office Visit Note  Patient Name: Jasmine Barron  [id number]  [id number]  Date of Service: 01/03/2023  Chief Complaint  Patient presents with   Hyperlipidemia   Hypertension   Follow-up    HPI Jasmine Barron presents for a follow-up visit for anxiety, insomnia, high cholesterol, and GERD.  Anxiety -- on citalopram 40 mg daily and prn clonazepam Insomnia -- has clonazepam as needed for insomnia and anxiety.  Hyperlipidemia -- takes rosuvastatin 5 mg daily GERD -- taking omeprazole which is effective    Current Medication: Outpatient Encounter Medications as of 01/03/2023  Medication Sig   calcium citrate-vitamin D (CITRACAL+D) 315-200 MG-UNIT tablet Take 1 tablet by mouth 2 (two) times daily.   citalopram (CELEXA) 40 MG tablet Take 1 tablet (40 mg total) by mouth daily.   linaclotide (LINZESS) 290 MCG CAPS capsule Take 1 capsule (290 mcg total) by mouth daily.   Omega-3 Fatty Acids (FISH OIL) 1000 MG CAPS Take 1 capsule by mouth daily.   omeprazole (PRILOSEC) 20 MG capsule Take 1 capsule (20 mg total) by mouth daily.   rosuvastatin (CRESTOR) 5 MG tablet Take 1 tablet (5 mg total) by mouth daily.   [DISCONTINUED] clonazePAM (KLONOPIN) 0.5 MG tablet Take one tab po qhs for anxiety and insomnia   clonazePAM (KLONOPIN) 0.5 MG tablet Take one tab po qhs for anxiety and insomnia   No facility-administered encounter medications on file as of 01/03/2023.    Surgical History: Past Surgical History:  Procedure Laterality Date   COLONOSCOPY     COLONOSCOPY WITH PROPOFOL N/A 02/04/2021   Procedure: COLONOSCOPY WITH PROPOFOL;  Surgeon: Wyline Mood, MD;  Location: [facility] ENDOSCOPY;  Service: Gastroenterology;  Laterality: N/A;   ESOPHAGOGASTRODUODENOSCOPY N/A 02/04/2021   Procedure: ESOPHAGOGASTRODUODENOSCOPY (EGD);  Surgeon: Wyline Mood, MD;  Location: [facility] ENDOSCOPY;  Service: Gastroenterology;  Laterality: N/A;    TONSILLECTOMY Bilateral     Medical History: Past Medical History:  Diagnosis Date   Anxiety    High cholesterol    Hypertension     Family History: Family History  Problem Relation Age of Onset   Diabetes Mother    Heart disease Mother    Hypertension Mother    Stroke Father    Diabetes Brother    Diabetes Maternal Grandmother     Social History   Socioeconomic History   Marital status: Divorced    Spouse name: Not on file   Number of children: Not on file   Years of education: Not on file   Highest education level: Not on file  Occupational History   Not on file  Tobacco Use   Smoking status: Never   Smokeless tobacco: Never  Vaping Use   Vaping status: Never Used  Substance and Sexual Activity   Alcohol use: Yes    Comment: 2-3 times a week wine and rum   Drug use: No   Sexual activity: Not on file  Other Topics Concern   Not on file  Social History Narrative   Not on file   Social Determinants of Health   Financial Resource Strain: Not on file  Food Insecurity: Not on file  Transportation Needs: Not on file  Physical Activity: Not on file  Stress: Not on file  Social Connections: Not on file  Intimate Partner Violence: Not on file      Review of Systems  Constitutional:  Negative for chills, fatigue and unexpected weight change.  HENT:  Negative for congestion, rhinorrhea, sneezing and sore throat.   Eyes:  Negative for redness.  Respiratory: Negative.  Negative for cough, chest tightness, shortness of breath and wheezing.   Cardiovascular: Negative.  Negative for chest pain and palpitations.  Gastrointestinal:  Negative for abdominal pain, constipation, diarrhea, nausea and vomiting.  Genitourinary:  Negative for dysuria and frequency.  Musculoskeletal:  Negative for arthralgias, back pain, joint swelling and neck pain.  Skin:  Negative for rash.  Neurological: Negative.  Negative for tremors and numbness.  Hematological:  Negative for  adenopathy. Does not bruise/bleed easily.  Psychiatric/Behavioral:  Positive for behavioral problems (Depression). Negative for self-injury, sleep disturbance and suicidal ideas. The patient is nervous/anxious.     Vital Signs: BP 134/88 Comment: 140/90  Pulse 66   Temp 98.1 F (36.7 C)   Resp 16   Ht 5\' 5"  (1.651 m)   Wt 150 lb (68 kg)   SpO2 99%   BMI 24.96 kg/m    Physical Exam Vitals reviewed.  Constitutional:      General: She is not in acute distress.    Appearance: Normal appearance. She is not ill-appearing.  HENT:     Head: Normocephalic and atraumatic.  Eyes:     Pupils: Pupils are equal, round, and reactive to light.  Cardiovascular:     Rate and Rhythm: Normal rate and regular rhythm.  Pulmonary:     Effort: Pulmonary effort is normal. No respiratory distress.  Neurological:     Mental Status: She is alert and oriented to person, place, and time.  Psychiatric:        Mood and Affect: Mood normal.        Behavior: Behavior normal.        Assessment/Plan: 1. GERD without esophagitis Continue omeprazole as prescribed.   2. Mixed hyperlipidemia Continue rosuvastatin as prescribed.   3. Other insomnia Continue prn clonazepam as prescribed. Follow up in 3 months for additional refills.  - clonazePAM (KLONOPIN) 0.5 MG tablet; Take one tab po qhs for anxiety and insomnia  Dispense: 30 tablet; Refill: 2  4. GAD (generalized anxiety disorder) Continue citalopram and prn clonazepam as prescribed. Follow up in 3 months for additional refills of clonazepam.    General Counseling: Jasmine Barron understanding of the findings of todays visit and agrees with plan of treatment. I have discussed any further diagnostic evaluation that may be needed or ordered today. We also reviewed her medications today. she has been encouraged to call the office with any questions or concerns that should arise related to todays visit.    No orders of the defined types were placed  in this encounter.   Meds ordered this encounter  Medications   clonazePAM (KLONOPIN) 0.5 MG tablet    Sig: Take one tab po qhs for anxiety and insomnia    Dispense:  30 tablet    Refill:  2    For future refills    Return in about 3 months (around 03/30/2023) for F/U, anxiety med refill, Erhardt Dada PCP.   Total time spent:30 Minutes Time spent includes review of chart, medications, test results, and follow up plan with the patient.   [place] Controlled Substance Database was reviewed by me.  This patient was seen by Sallyanne Kuster, FNP-C in collaboration with Dr. Beverely Risen as a part of collaborative care agreement.   Erinn Mendosa R. Tedd Sias, MSN, FNP-C Internal medicine

## 2023-01-05 ENCOUNTER — Ambulatory Visit: Payer: Medicare PPO | Admitting: Nurse Practitioner

## 2023-02-19 ENCOUNTER — Encounter: Payer: Self-pay | Admitting: Nurse Practitioner

## 2023-04-22 ENCOUNTER — Telehealth: Payer: Self-pay | Admitting: Nurse Practitioner

## 2023-04-22 NOTE — Telephone Encounter (Signed)
Left vm and sent mychart message to confirm 04/29/23 appointment-Jasmine Barron

## 2023-04-29 ENCOUNTER — Ambulatory Visit (INDEPENDENT_AMBULATORY_CARE_PROVIDER_SITE_OTHER): Payer: Medicare PPO | Admitting: Nurse Practitioner

## 2023-04-29 ENCOUNTER — Encounter: Payer: Self-pay | Admitting: Nurse Practitioner

## 2023-04-29 ENCOUNTER — Telehealth: Payer: Self-pay | Admitting: Nurse Practitioner

## 2023-04-29 VITALS — BP 130/88 | HR 83 | Temp 98.3°F | Resp 16 | Ht 65.0 in | Wt 155.2 lb

## 2023-04-29 DIAGNOSIS — R262 Difficulty in walking, not elsewhere classified: Secondary | ICD-10-CM

## 2023-04-29 DIAGNOSIS — K219 Gastro-esophageal reflux disease without esophagitis: Secondary | ICD-10-CM | POA: Diagnosis not present

## 2023-04-29 DIAGNOSIS — G4709 Other insomnia: Secondary | ICD-10-CM

## 2023-04-29 DIAGNOSIS — Z0000 Encounter for general adult medical examination without abnormal findings: Secondary | ICD-10-CM

## 2023-04-29 DIAGNOSIS — R6889 Other general symptoms and signs: Secondary | ICD-10-CM | POA: Diagnosis not present

## 2023-04-29 DIAGNOSIS — K5909 Other constipation: Secondary | ICD-10-CM | POA: Diagnosis not present

## 2023-04-29 DIAGNOSIS — E782 Mixed hyperlipidemia: Secondary | ICD-10-CM | POA: Diagnosis not present

## 2023-04-29 MED ORDER — CLONAZEPAM 0.5 MG PO TABS: ORAL_TABLET | ORAL | 2 refills | Status: DC

## 2023-04-29 MED ORDER — ROSUVASTATIN CALCIUM 5 MG PO TABS: 5.0000 mg | ORAL_TABLET | Freq: Every day | ORAL | 3 refills | Status: DC

## 2023-04-29 MED ORDER — LINACLOTIDE 290 MCG PO CAPS: 290.0000 ug | ORAL_CAPSULE | Freq: Every day | ORAL | 3 refills | Status: DC

## 2023-04-29 MED ORDER — OMEPRAZOLE 20 MG PO CPDR: 20.0000 mg | DELAYED_RELEASE_CAPSULE | Freq: Every day | ORAL | 3 refills | Status: DC

## 2023-04-29 NOTE — Telephone Encounter (Signed)
Awaiting 04/29/23 office notes for Physical Therapy referral-Toni

## 2023-04-29 NOTE — Progress Notes (Signed)
[facility] [address] [place], [place] [address]  Internal MEDICINE  Office Visit Note  Patient Name: Jasmine Barron  [id number]  [id number]  Date of Service: 04/29/2023  Chief Complaint  Patient presents with   Hypertension   Hyperlipidemia   Medicare Wellness    HPI Leslea presents for a medicare annual wellness visit.  Well-appearing 68 y.o. female with  hypertension, depression, high cholesterol and anxiety.  Routine CRC screening: due in November this year  Routine mammogram: due in June this year  DEXA scan: last done in 2022 at [facility]: due in June this year  New or worsening pain: none  Other concerns: having difficulty getting out of a chair to standing, walking up stairs is difficult, getting up off the floor is difficult as well if she is sitting or laying on the floor. Requesting physical therapy.      04/29/2023    9:15 AM 04/23/2022    9:12 AM 02/19/2020   10:24 AM  MMSE - Mini Mental State Exam  Orientation to time 5 5 5   Orientation to Place 5 5 5   Registration 3 3 3   Attention/ Calculation 5 5 5   Recall 3 3 3   Language- name 2 objects 2 2 2   Language- repeat 1 1 1   Language- follow 3 step command 3 3 3   Language- read & follow direction 1 1 1   Write a sentence 1 1 1   Copy design 1 1 1   Total score 30 30 30     Functional Status Survey: Is the patient deaf or have difficulty hearing?: No Does the patient have difficulty seeing, even when wearing glasses/contacts?: Yes Does the patient have difficulty concentrating, remembering, or making decisions?: Yes Does the patient have difficulty walking or climbing stairs?: Yes Does the patient have difficulty dressing or bathing?: No Does the patient have difficulty doing errands alone such as visiting a doctor's office or shopping?: No     04/22/2021   10:16 AM 07/15/2021   11:21 AM 10/21/2021   10:48 AM 04/23/2022    9:10 AM 04/29/2023    9:10 AM  Fall Risk  Falls in the past year? 1 1 1 1 1    Was there an injury with Fall? 0 0 0 0 0  Fall Risk Category Calculator 2  1 1 2   Fall Risk Category (Retired) Moderate  Low    (RETIRED) Patient Fall Risk Level Moderate fall risk  Low fall risk    Fall risk Follow up Falls evaluation completed  Falls evaluation completed Falls evaluation completed Falls evaluation completed       04/29/2023    9:11 AM  Depression screen PHQ 2/9  Decreased Interest 0  Down, Depressed, Hopeless 0  PHQ - 2 Score 0        Current Medication: Outpatient Encounter Medications as of 04/29/2023  Medication Sig   calcium citrate-vitamin D (CITRACAL+D) 315-200 MG-UNIT tablet Take 1 tablet by mouth 2 (two) times daily.   citalopram (CELEXA) 40 MG tablet Take 1 tablet (40 mg total) by mouth daily.   Omega-3 Fatty Acids (FISH OIL) 1000 MG CAPS Take 1 capsule by mouth daily.   [DISCONTINUED] clonazePAM (KLONOPIN) 0.5 MG tablet Take one tab po qhs for anxiety and insomnia   [DISCONTINUED] linaclotide (LINZESS) 290 MCG CAPS capsule Take 1 capsule (290 mcg total) by mouth daily.   [DISCONTINUED] omeprazole (PRILOSEC) 20 MG capsule Take 1 capsule (20 mg total) by mouth daily.   [DISCONTINUED]  rosuvastatin (CRESTOR) 5 MG tablet Take 1 tablet (5 mg total) by mouth daily.   clonazePAM (KLONOPIN) 0.5 MG tablet Take one tab po qhs for anxiety and insomnia   linaclotide (LINZESS) 290 MCG CAPS capsule Take 1 capsule (290 mcg total) by mouth daily.   omeprazole (PRILOSEC) 20 MG capsule Take 1 capsule (20 mg total) by mouth daily.   rosuvastatin (CRESTOR) 5 MG tablet Take 1 tablet (5 mg total) by mouth daily.   No facility-administered encounter medications on file as of 04/29/2023.    Surgical History: Past Surgical History:  Procedure Laterality Date   COLONOSCOPY     COLONOSCOPY WITH PROPOFOL N/A 02/04/2021   Procedure: COLONOSCOPY WITH PROPOFOL;  Surgeon: Wyline Mood, MD;  Location: [facility] ENDOSCOPY;  Service: Gastroenterology;  Laterality: N/A;    ESOPHAGOGASTRODUODENOSCOPY N/A 02/04/2021   Procedure: ESOPHAGOGASTRODUODENOSCOPY (EGD);  Surgeon: Wyline Mood, MD;  Location: [facility] ENDOSCOPY;  Service: Gastroenterology;  Laterality: N/A;   TONSILLECTOMY Bilateral     Medical History: Past Medical History:  Diagnosis Date   Anxiety    High cholesterol    Hypertension     Family History: Family History  Problem Relation Age of Onset   Diabetes Mother    Heart disease Mother    Hypertension Mother    Stroke Father    Diabetes Brother    Diabetes Maternal Grandmother     Social History   Socioeconomic History   Marital status: Divorced    Spouse name: Not on file   Number of children: Not on file   Years of education: Not on file   Highest education level: Not on file  Occupational History   Not on file  Tobacco Use   Smoking status: Never   Smokeless tobacco: Never  Vaping Use   Vaping status: Never Used  Substance and Sexual Activity   Alcohol use: Yes    Comment: 2-3 times a week wine and rum   Drug use: No   Sexual activity: Not on file  Other Topics Concern   Not on file  Social History Narrative   Not on file   Social Drivers of Health   Financial Resource Strain: Not on file  Food Insecurity: Not on file  Transportation Needs: Not on file  Physical Activity: Not on file  Stress: Not on file  Social Connections: Not on file  Intimate Partner Violence: Not on file      Review of Systems  Constitutional:  Negative for activity change, appetite change, chills, fatigue, fever and unexpected weight change.  HENT: Negative.  Negative for congestion, ear pain, rhinorrhea, sore throat and trouble swallowing.   Eyes: Negative.   Respiratory: Negative.  Negative for cough, chest tightness, shortness of breath and wheezing.   Cardiovascular: Negative.  Negative for chest pain.  Gastrointestinal:  Positive for constipation and diarrhea. Negative for abdominal pain, blood in stool, nausea and vomiting.   Endocrine: Negative.   Genitourinary: Negative.  Negative for difficulty urinating, dysuria, frequency, hematuria and urgency.  Musculoskeletal: Negative.  Negative for arthralgias, back pain, joint swelling, myalgias and neck pain.  Skin: Negative.  Negative for rash and wound.  Allergic/Immunologic: Negative.  Negative for immunocompromised state.  Neurological:  Positive for dizziness. Negative for seizures, numbness and headaches.  Hematological: Negative.   Psychiatric/Behavioral: Negative.  Negative for behavioral problems, self-injury and suicidal ideas. The patient is not nervous/anxious.     Vital Signs: BP 130/88   Pulse 83   Temp 98.3 F (36.8 C)  Resp 16   Ht 5\' 5"  (1.651 m)   Wt 155 lb 3.2 oz (70.4 kg)   SpO2 99%   BMI 25.83 kg/m    Physical Exam Vitals reviewed.  Constitutional:      General: She is awake. She is not in acute distress.    Appearance: Normal appearance. She is well-developed, well-groomed and normal weight. She is not ill-appearing or diaphoretic.  HENT:     Head: Normocephalic and atraumatic.     Right Ear: Tympanic membrane, ear canal and external ear normal.     Left Ear: Tympanic membrane, ear canal and external ear normal.     Nose: Nose normal. No congestion or rhinorrhea.     Mouth/Throat:     Lips: Pink.     Mouth: Mucous membranes are moist.     Pharynx: Oropharynx is clear. Uvula midline. No oropharyngeal exudate or posterior oropharyngeal erythema.  Eyes:     General: Lids are normal. Vision grossly intact. Gaze aligned appropriately. No scleral icterus.       Right eye: No discharge.        Left eye: No discharge.     Extraocular Movements: Extraocular movements intact.     Conjunctiva/sclera: Conjunctivae normal.     Pupils: Pupils are equal, round, and reactive to light.     Funduscopic exam:    Right eye: Red reflex present.        Left eye: Red reflex present. Neck:     Thyroid: No thyromegaly.     Vascular: No JVD.      Trachea: Trachea and phonation normal. No tracheal deviation.  Cardiovascular:     Rate and Rhythm: Normal rate and regular rhythm.     Pulses: Normal pulses.     Heart sounds: Normal heart sounds, S1 normal and S2 normal. No murmur heard.    No friction rub. No gallop.  Pulmonary:     Effort: Pulmonary effort is normal. No accessory muscle usage or respiratory distress.     Breath sounds: Normal breath sounds and air entry. No stridor. No wheezing or rales.  Chest:     Chest wall: No tenderness.     Comments: Declined breast exam, gets annual mammograms.  Abdominal:     General: Bowel sounds are normal. There is no distension.     Palpations: Abdomen is soft. There is no shifting dullness, fluid wave, mass or pulsatile mass.     Tenderness: There is no abdominal tenderness. There is no guarding or rebound.  Musculoskeletal:        General: No tenderness or deformity. Normal range of motion.     Cervical back: Normal range of motion and neck supple.     Right lower leg: No edema.     Left lower leg: No edema.  Lymphadenopathy:     Cervical: No cervical adenopathy.  Skin:    General: Skin is warm and dry.     Capillary Refill: Capillary refill takes less than 2 seconds.     Coloration: Skin is not pale.     Findings: No erythema or rash.  Neurological:     Mental Status: She is alert and oriented to person, place, and time.     Cranial Nerves: No cranial nerve deficit.     Motor: No abnormal muscle tone.     Coordination: Coordination normal.     Gait: Gait normal.     Deep Tendon Reflexes: Reflexes are normal and symmetric.  Psychiatric:  Mood and Affect: Mood normal.        Behavior: Behavior normal. Behavior is cooperative.        Thought Content: Thought content normal.        Judgment: Judgment normal.        Assessment/Plan: 1. Encounter for subsequent annual wellness visit (AWV) in Medicare patient (Primary) Age-appropriate preventive screenings and  vaccinations discussed, annual physical exam completed. Routine labs for health maintenance ordered (if needed). PHM updated.    2. Difficulty getting out of chair Referred for physical therapy - Ambulatory referral to Physical Therapy  3. Difficulty walking up stairs Referred for physical therapy  - Ambulatory referral to Physical Therapy  4. Chronic constipation Continue linzess as prescribed. Refills ordered  - linaclotide (LINZESS) 290 MCG CAPS capsule; Take 1 capsule (290 mcg total) by mouth daily.  Dispense: 90 capsule; Refill: 3  5. GERD without esophagitis Continue omeprazole as prescribed,refills ordered  - omeprazole (PRILOSEC) 20 MG capsule; Take 1 capsule (20 mg total) by mouth daily.  Dispense: 90 capsule; Refill: 3  6. Mixed hyperlipidemia Continue rosuvastatin as prescribed, refills ordered - rosuvastatin (CRESTOR) 5 MG tablet; Take 1 tablet (5 mg total) by mouth daily.  Dispense: 90 tablet; Refill: 3  7. Other insomnia Continue clonazepam as prescribed, follow up in 3 months for additional refills. Due for UDS at next office visit  - clonazePAM (KLONOPIN) 0.5 MG tablet; Take one tab po qhs for anxiety and insomnia  Dispense: 30 tablet; Refill: 2      General Counseling: Aleasha verbalizes understanding of the findings of todays visit and agrees with plan of treatment. I have discussed any further diagnostic evaluation that may be needed or ordered today. We also reviewed her medications today. she has been encouraged to call the office with any questions or concerns that should arise related to todays visit.    Orders Placed This Encounter  Procedures   Ambulatory referral to Physical Therapy    Meds ordered this encounter  Medications   clonazePAM (KLONOPIN) 0.5 MG tablet    Sig: Take one tab po qhs for anxiety and insomnia    Dispense:  30 tablet    Refill:  2    For future refills   linaclotide (LINZESS) 290 MCG CAPS capsule    Sig: Take 1 capsule (290 mcg  total) by mouth daily.    Dispense:  90 capsule    Refill:  3    For future refill   omeprazole (PRILOSEC) 20 MG capsule    Sig: Take 1 capsule (20 mg total) by mouth daily.    Dispense:  90 capsule    Refill:  3   rosuvastatin (CRESTOR) 5 MG tablet    Sig: Take 1 tablet (5 mg total) by mouth daily.    Dispense:  90 tablet    Refill:  3    Return in about 3 months (around 07/20/2023) for F/U, anxiety med refill, [facility] PCP.   Total time spent:30 Minutes Time spent includes review of chart, medications, test results, and follow up plan with the patient.   [place] Controlled Substance Database was reviewed by me.  This patient was seen by Sallyanne Kuster, FNP-C in collaboration with Dr. Beverely Risen as a part of collaborative care agreement.  Erdine Hulen R. Tedd Sias, MSN, FNP-C Internal medicine

## 2023-05-03 ENCOUNTER — Telehealth: Payer: Self-pay | Admitting: Nurse Practitioner

## 2023-05-03 NOTE — Telephone Encounter (Signed)
Physical Therapy referral faxed to [facility]; [phone]. Lvm notifying patient. Gave pt telephone [phone]

## 2023-05-20 ENCOUNTER — Telehealth: Payer: Self-pay | Admitting: Nurse Practitioner

## 2023-05-20 DIAGNOSIS — R6889 Other general symptoms and signs: Secondary | ICD-10-CM

## 2023-05-20 DIAGNOSIS — R262 Difficulty in walking, not elsewhere classified: Secondary | ICD-10-CM

## 2023-05-30 ENCOUNTER — Telehealth: Payer: Self-pay | Admitting: Nurse Practitioner

## 2023-05-30 NOTE — Telephone Encounter (Signed)
 Updated PT referral faxed to [facility]; [phone]. Notified patient-Jasmine Barron

## 2023-06-01 NOTE — Telephone Encounter (Signed)
 error

## 2023-06-02 DIAGNOSIS — R2681 Unsteadiness on feet: Secondary | ICD-10-CM | POA: Diagnosis not present

## 2023-06-02 DIAGNOSIS — R2689 Other abnormalities of gait and mobility: Secondary | ICD-10-CM | POA: Diagnosis not present

## 2023-06-02 DIAGNOSIS — M6281 Muscle weakness (generalized): Secondary | ICD-10-CM | POA: Diagnosis not present

## 2023-06-03 ENCOUNTER — Telehealth: Payer: Self-pay | Admitting: Nurse Practitioner

## 2023-06-03 NOTE — Telephone Encounter (Signed)
 Received PT orders from [facility]. Gave to Alyssa for signature-Toni

## 2023-06-06 DIAGNOSIS — R2689 Other abnormalities of gait and mobility: Secondary | ICD-10-CM | POA: Diagnosis not present

## 2023-06-06 DIAGNOSIS — R2681 Unsteadiness on feet: Secondary | ICD-10-CM | POA: Diagnosis not present

## 2023-06-06 DIAGNOSIS — M6281 Muscle weakness (generalized): Secondary | ICD-10-CM | POA: Diagnosis not present

## 2023-06-09 DIAGNOSIS — R2681 Unsteadiness on feet: Secondary | ICD-10-CM | POA: Diagnosis not present

## 2023-06-09 DIAGNOSIS — R2689 Other abnormalities of gait and mobility: Secondary | ICD-10-CM | POA: Diagnosis not present

## 2023-06-09 DIAGNOSIS — M6281 Muscle weakness (generalized): Secondary | ICD-10-CM | POA: Diagnosis not present

## 2023-06-13 ENCOUNTER — Telehealth: Payer: Self-pay | Admitting: Nurse Practitioner

## 2023-06-13 DIAGNOSIS — R2681 Unsteadiness on feet: Secondary | ICD-10-CM | POA: Diagnosis not present

## 2023-06-13 DIAGNOSIS — R2689 Other abnormalities of gait and mobility: Secondary | ICD-10-CM | POA: Diagnosis not present

## 2023-06-13 DIAGNOSIS — M6281 Muscle weakness (generalized): Secondary | ICD-10-CM | POA: Diagnosis not present

## 2023-06-13 NOTE — Telephone Encounter (Signed)
 PT orders signed. Faxed back to [place]; [phone]. Scanned-Toni

## 2023-06-16 DIAGNOSIS — R2681 Unsteadiness on feet: Secondary | ICD-10-CM | POA: Diagnosis not present

## 2023-06-16 DIAGNOSIS — M6281 Muscle weakness (generalized): Secondary | ICD-10-CM | POA: Diagnosis not present

## 2023-06-16 DIAGNOSIS — R2689 Other abnormalities of gait and mobility: Secondary | ICD-10-CM | POA: Diagnosis not present

## 2023-06-22 DIAGNOSIS — M6281 Muscle weakness (generalized): Secondary | ICD-10-CM | POA: Diagnosis not present

## 2023-06-22 DIAGNOSIS — R2681 Unsteadiness on feet: Secondary | ICD-10-CM | POA: Diagnosis not present

## 2023-06-22 DIAGNOSIS — R2689 Other abnormalities of gait and mobility: Secondary | ICD-10-CM | POA: Diagnosis not present

## 2023-06-24 DIAGNOSIS — R2681 Unsteadiness on feet: Secondary | ICD-10-CM | POA: Diagnosis not present

## 2023-06-24 DIAGNOSIS — R2689 Other abnormalities of gait and mobility: Secondary | ICD-10-CM | POA: Diagnosis not present

## 2023-06-24 DIAGNOSIS — M6281 Muscle weakness (generalized): Secondary | ICD-10-CM | POA: Diagnosis not present

## 2023-06-27 DIAGNOSIS — R2689 Other abnormalities of gait and mobility: Secondary | ICD-10-CM | POA: Diagnosis not present

## 2023-06-27 DIAGNOSIS — M6281 Muscle weakness (generalized): Secondary | ICD-10-CM | POA: Diagnosis not present

## 2023-06-27 DIAGNOSIS — R2681 Unsteadiness on feet: Secondary | ICD-10-CM | POA: Diagnosis not present

## 2023-06-30 ENCOUNTER — Telehealth: Payer: Self-pay | Admitting: Nurse Practitioner

## 2023-06-30 DIAGNOSIS — M6281 Muscle weakness (generalized): Secondary | ICD-10-CM | POA: Diagnosis not present

## 2023-06-30 DIAGNOSIS — R2681 Unsteadiness on feet: Secondary | ICD-10-CM | POA: Diagnosis not present

## 2023-06-30 DIAGNOSIS — R2689 Other abnormalities of gait and mobility: Secondary | ICD-10-CM | POA: Diagnosis not present

## 2023-06-30 NOTE — Telephone Encounter (Signed)
 Received 06/30/23 PT orders from [facility]. Gave to Alyssa for signature-Toni

## 2023-07-04 ENCOUNTER — Telehealth: Payer: Self-pay | Admitting: Nurse Practitioner

## 2023-07-04 NOTE — Telephone Encounter (Signed)
 06/30/23 PT order signed & faxed back to [facility]; [phone]. Scanned-Toni

## 2023-07-05 DIAGNOSIS — R2689 Other abnormalities of gait and mobility: Secondary | ICD-10-CM | POA: Diagnosis not present

## 2023-07-05 DIAGNOSIS — M6281 Muscle weakness (generalized): Secondary | ICD-10-CM | POA: Diagnosis not present

## 2023-07-05 DIAGNOSIS — R2681 Unsteadiness on feet: Secondary | ICD-10-CM | POA: Diagnosis not present

## 2023-07-12 DIAGNOSIS — R2689 Other abnormalities of gait and mobility: Secondary | ICD-10-CM | POA: Diagnosis not present

## 2023-07-12 DIAGNOSIS — R2681 Unsteadiness on feet: Secondary | ICD-10-CM | POA: Diagnosis not present

## 2023-07-12 DIAGNOSIS — M6281 Muscle weakness (generalized): Secondary | ICD-10-CM | POA: Diagnosis not present

## 2023-07-20 DIAGNOSIS — M6281 Muscle weakness (generalized): Secondary | ICD-10-CM | POA: Diagnosis not present

## 2023-07-20 DIAGNOSIS — R2681 Unsteadiness on feet: Secondary | ICD-10-CM | POA: Diagnosis not present

## 2023-07-20 DIAGNOSIS — R2689 Other abnormalities of gait and mobility: Secondary | ICD-10-CM | POA: Diagnosis not present

## 2023-07-26 ENCOUNTER — Encounter: Payer: Self-pay | Admitting: Nurse Practitioner

## 2023-07-26 ENCOUNTER — Ambulatory Visit: Payer: Medicare PPO | Admitting: Nurse Practitioner

## 2023-07-26 VITALS — BP 128/80 | HR 100 | Temp 97.3°F | Resp 16 | Ht 65.0 in | Wt 157.2 lb

## 2023-07-26 DIAGNOSIS — E559 Vitamin D deficiency, unspecified: Secondary | ICD-10-CM | POA: Diagnosis not present

## 2023-07-26 DIAGNOSIS — G4709 Other insomnia: Secondary | ICD-10-CM

## 2023-07-26 DIAGNOSIS — E782 Mixed hyperlipidemia: Secondary | ICD-10-CM | POA: Diagnosis not present

## 2023-07-26 DIAGNOSIS — E538 Deficiency of other specified B group vitamins: Secondary | ICD-10-CM | POA: Diagnosis not present

## 2023-07-26 DIAGNOSIS — R7301 Impaired fasting glucose: Secondary | ICD-10-CM

## 2023-07-26 DIAGNOSIS — R5383 Other fatigue: Secondary | ICD-10-CM | POA: Diagnosis not present

## 2023-07-26 MED ORDER — CLONAZEPAM 0.5 MG PO TABS: ORAL_TABLET | ORAL | 2 refills | Status: DC

## 2023-07-26 NOTE — Progress Notes (Signed)
 [facility] [address] [place], [place] [address]  Internal MEDICINE  Office Visit Note  Patient Name: Jasmine Barron  [id number]  [id number]  Date of Service: 07/26/2023  Chief Complaint  Patient presents with   Hyperlipidemia   Hypertension   Follow-up    HPI Jasmine Barron presents for a follow-up visit for fatigue, insomnia, and constipation, lab orders and refills.  Fatigue -- started Seeing a nutritionist, they are requesting specific labs to be ordered  Due for routine labs  insomnia -- takes clonazepam  as needed  Constipation -- takes linzess  which helps.     Current Medication: Outpatient Encounter Medications as of 07/26/2023  Medication Sig   calcium  citrate-vitamin D  (CITRACAL+D) 315-200 MG-UNIT tablet Take 1 tablet by mouth 2 (two) times daily.   citalopram  (CELEXA ) 40 MG tablet Take 1 tablet (40 mg total) by mouth daily.   linaclotide  (LINZESS ) 290 MCG CAPS capsule Take 1 capsule (290 mcg total) by mouth daily.   Omega-3 Fatty Acids (FISH OIL) 1000 MG CAPS Take 1 capsule by mouth daily.   omeprazole  (PRILOSEC) 20 MG capsule Take 1 capsule (20 mg total) by mouth daily.   rosuvastatin  (CRESTOR ) 5 MG tablet Take 1 tablet (5 mg total) by mouth daily.   [DISCONTINUED] clonazePAM  (KLONOPIN ) 0.5 MG tablet Take one tab po qhs for anxiety and insomnia   clonazePAM  (KLONOPIN ) 0.5 MG tablet Take one tab po qhs for anxiety and insomnia   No facility-administered encounter medications on file as of 07/26/2023.    Surgical History: Past Surgical History:  Procedure Laterality Date   COLONOSCOPY     COLONOSCOPY WITH PROPOFOL  N/A 02/04/2021   Procedure: COLONOSCOPY WITH PROPOFOL ;  Surgeon: Luke Salaam, MD;  Location: [facility] ENDOSCOPY;  Service: Gastroenterology;  Laterality: N/A;   ESOPHAGOGASTRODUODENOSCOPY N/A 02/04/2021   Procedure: ESOPHAGOGASTRODUODENOSCOPY (EGD);  Surgeon: Luke Salaam, MD;  Location: [facility] ENDOSCOPY;  Service: Gastroenterology;  Laterality: N/A;    TONSILLECTOMY Bilateral     Medical History: Past Medical History:  Diagnosis Date   Anxiety    High cholesterol    Hypertension     Family History: Family History  Problem Relation Age of Onset   Diabetes Mother    Heart disease Mother    Hypertension Mother    Stroke Father    Diabetes Brother    Diabetes Maternal Grandmother     Social History   Socioeconomic History   Marital status: Divorced    Spouse name: Not on file   Number of children: Not on file   Years of education: Not on file   Highest education level: Not on file  Occupational History   Not on file  Tobacco Use   Smoking status: Never   Smokeless tobacco: Never  Vaping Use   Vaping status: Never Used  Substance and Sexual Activity   Alcohol use: Yes    Comment: 2-3 times a week wine and rum   Drug use: No   Sexual activity: Not on file  Other Topics Concern   Not on file  Social History Narrative   Not on file   Social Drivers of Health   Financial Resource Strain: Not on file  Food Insecurity: Not on file  Transportation Needs: Not on file  Physical Activity: Not on file  Stress: Not on file  Social Connections: Not on file  Intimate Partner Violence: Not on file      Review of Systems  Constitutional:  Positive for fatigue.  Respiratory: Negative.  Negative  for cough, chest tightness, shortness of breath and wheezing.   Cardiovascular: Negative.  Negative for chest pain and palpitations.  Gastrointestinal:  Positive for constipation.  Genitourinary: Negative.   Musculoskeletal: Negative.   Psychiatric/Behavioral:  Positive for sleep disturbance. Negative for self-injury. The patient is not nervous/anxious.     Vital Signs: BP 128/80   Pulse 100   Temp (!) 97.3 F (36.3 C)   Resp 16   Ht 5\' 5"  (1.651 m)   Wt 157 lb 3.2 oz (71.3 kg)   SpO2 99%   BMI 26.16 kg/m    Physical Exam Vitals reviewed.  Constitutional:      General: She is not in acute distress.     Appearance: Normal appearance. She is not ill-appearing.  HENT:     Head: Normocephalic and atraumatic.  Eyes:     Pupils: Pupils are equal, round, and reactive to light.  Cardiovascular:     Rate and Rhythm: Normal rate and regular rhythm.  Pulmonary:     Effort: Pulmonary effort is normal. No respiratory distress.  Neurological:     Mental Status: She is alert and oriented to person, place, and time.  Psychiatric:        Mood and Affect: Mood normal.        Behavior: Behavior normal.        Assessment/Plan: 1. Other fatigue (Primary) Routine labs ordered  - B12 and Folate Panel - Iron, TIBC and Ferritin Panel - Vitamin D  (25 hydroxy) - TSH + free T4  2. Impaired fasting glucose Routine labs ordered - TSH + free T4 - CMP14+EGFR - Lipid Profile - Hgb A1C w/o eAG  3. Other insomnia Continue clonazepam  as prescribed follow up in 3 months for additional refills - clonazePAM  (KLONOPIN ) 0.5 MG tablet; Take one tab po qhs for anxiety and insomnia  Dispense: 30 tablet; Refill: 2  4. Mixed hyperlipidemia Routine labs ordered  - TSH + free T4 - CMP14+EGFR - Lipid Profile  5. B12 deficiency Routine labs ordered  - B12 and Folate Panel - Iron, TIBC and Ferritin Panel - CBC with Differential/Platelet  6. Vitamin D  deficiency Routine lab ordered  - Vitamin D  (25 hydroxy)   General Counseling: Jasmine Barron verbalizes understanding of the findings of todays visit and agrees with plan of treatment. I have discussed any further diagnostic evaluation that may be needed or ordered today. We also reviewed her medications today. she has been encouraged to call the office with any questions or concerns that should arise related to todays visit.    Orders Placed This Encounter  Procedures   B12 and Folate Panel   Iron, TIBC and Ferritin Panel   Vitamin D  (25 hydroxy)   TSH + free T4   CBC with Differential/Platelet   CMP14+EGFR   Lipid Profile   Hgb A1C w/o eAG    Meds ordered  this encounter  Medications   clonazePAM  (KLONOPIN ) 0.5 MG tablet    Sig: Take one tab po qhs for anxiety and insomnia    Dispense:  30 tablet    Refill:  2    For future refills    Return in about 3 months (around 10/13/2023) for F/U, anxiety med refill, [facility] PCP, Labs.   Total time spent:30 Minutes Time spent includes review of chart, medications, test results, and follow up plan with the patient.   [place] Controlled Substance Database was reviewed by me.  This patient was seen by Laurence Pons, FNP-C in collaboration with Dr.  Verneta Gone as a part of collaborative care agreement.   Martez Weiand R. Bobbi Burow, MSN, FNP-C Internal medicine

## 2023-07-27 DIAGNOSIS — M6281 Muscle weakness (generalized): Secondary | ICD-10-CM | POA: Diagnosis not present

## 2023-07-27 DIAGNOSIS — R2689 Other abnormalities of gait and mobility: Secondary | ICD-10-CM | POA: Diagnosis not present

## 2023-07-27 DIAGNOSIS — R2681 Unsteadiness on feet: Secondary | ICD-10-CM | POA: Diagnosis not present

## 2023-08-02 DIAGNOSIS — M6281 Muscle weakness (generalized): Secondary | ICD-10-CM | POA: Diagnosis not present

## 2023-08-02 DIAGNOSIS — R2681 Unsteadiness on feet: Secondary | ICD-10-CM | POA: Diagnosis not present

## 2023-08-02 DIAGNOSIS — R2689 Other abnormalities of gait and mobility: Secondary | ICD-10-CM | POA: Diagnosis not present

## 2023-08-10 DIAGNOSIS — R2689 Other abnormalities of gait and mobility: Secondary | ICD-10-CM | POA: Diagnosis not present

## 2023-08-10 DIAGNOSIS — M6281 Muscle weakness (generalized): Secondary | ICD-10-CM | POA: Diagnosis not present

## 2023-08-10 DIAGNOSIS — R2681 Unsteadiness on feet: Secondary | ICD-10-CM | POA: Diagnosis not present

## 2023-08-16 ENCOUNTER — Telehealth: Payer: Self-pay | Admitting: Nurse Practitioner

## 2023-08-16 NOTE — Telephone Encounter (Signed)
 Received progress report from [facility]. Gave to Alyssa for signature-Toni

## 2023-08-17 DIAGNOSIS — M6281 Muscle weakness (generalized): Secondary | ICD-10-CM | POA: Diagnosis not present

## 2023-08-17 DIAGNOSIS — R2689 Other abnormalities of gait and mobility: Secondary | ICD-10-CM | POA: Diagnosis not present

## 2023-08-17 DIAGNOSIS — R2681 Unsteadiness on feet: Secondary | ICD-10-CM | POA: Diagnosis not present

## 2023-08-23 ENCOUNTER — Telehealth: Payer: Self-pay | Admitting: Nurse Practitioner

## 2023-08-23 NOTE — Telephone Encounter (Signed)
 PT progress report signed. Faxed back to [facility]; [phone]. Scanned-Toni

## 2023-08-24 ENCOUNTER — Telehealth: Payer: Self-pay | Admitting: Nurse Practitioner

## 2023-08-24 DIAGNOSIS — R2689 Other abnormalities of gait and mobility: Secondary | ICD-10-CM | POA: Diagnosis not present

## 2023-08-24 DIAGNOSIS — R2681 Unsteadiness on feet: Secondary | ICD-10-CM | POA: Diagnosis not present

## 2023-08-24 DIAGNOSIS — M6281 Muscle weakness (generalized): Secondary | ICD-10-CM | POA: Diagnosis not present

## 2023-08-24 NOTE — Telephone Encounter (Signed)
 Received 08/24/2023 PT discharge order. Gave to Alyssa for signature-Toni

## 2023-08-30 ENCOUNTER — Telehealth: Payer: Self-pay | Admitting: Nurse Practitioner

## 2023-08-30 NOTE — Telephone Encounter (Signed)
 08/24/2023 PT order signed. Faxed back to [facility]; [phone]. Scanned-Toni

## 2023-09-02 DIAGNOSIS — E538 Deficiency of other specified B group vitamins: Secondary | ICD-10-CM | POA: Diagnosis not present

## 2023-09-02 DIAGNOSIS — E782 Mixed hyperlipidemia: Secondary | ICD-10-CM | POA: Diagnosis not present

## 2023-09-02 DIAGNOSIS — E559 Vitamin D deficiency, unspecified: Secondary | ICD-10-CM | POA: Diagnosis not present

## 2023-09-02 DIAGNOSIS — R7301 Impaired fasting glucose: Secondary | ICD-10-CM | POA: Diagnosis not present

## 2023-09-02 DIAGNOSIS — R5383 Other fatigue: Secondary | ICD-10-CM | POA: Diagnosis not present

## 2023-09-03 LAB — CBC WITH DIFFERENTIAL/PLATELET
Basophils Absolute: 0 10*3/uL (ref 0.0–0.2)
Basos: 1 %
EOS (ABSOLUTE): 0.1 10*3/uL (ref 0.0–0.4)
Eos: 1 %
Hematocrit: 42.5 % (ref 34.0–46.6)
Hemoglobin: 14.1 g/dL (ref 11.1–15.9)
Immature Grans (Abs): 0 10*3/uL (ref 0.0–0.1)
Immature Granulocytes: 0 %
Lymphocytes Absolute: 1.9 10*3/uL (ref 0.7–3.1)
Lymphs: 42 %
MCH: 29 pg (ref 26.6–33.0)
MCHC: 33.2 g/dL (ref 31.5–35.7)
MCV: 87 fL (ref 79–97)
Monocytes Absolute: 0.5 10*3/uL (ref 0.1–0.9)
Monocytes: 10 %
Neutrophils Absolute: 2.1 10*3/uL (ref 1.4–7.0)
Neutrophils: 46 %
Platelets: 264 10*3/uL (ref 150–450)
RBC: 4.86 x10E6/uL (ref 3.77–5.28)
RDW: 14 % (ref 11.7–15.4)
WBC: 4.5 10*3/uL (ref 3.4–10.8)

## 2023-09-03 LAB — LIPID PANEL
Chol/HDL Ratio: 3.3 ratio (ref 0.0–4.4)
Cholesterol, Total: 194 mg/dL (ref 100–199)
HDL: 59 mg/dL (ref >39)
LDL Chol Calc (NIH): 105 mg/dL — ABNORMAL HIGH (ref 0–99)
Triglycerides: 176 mg/dL — ABNORMAL HIGH (ref 0–149)
VLDL Cholesterol Cal: 30 mg/dL (ref 5–40)

## 2023-09-03 LAB — B12 AND FOLATE PANEL
Folate: 12.2 ng/mL (ref >3.0)
Vitamin B-12: 1362 pg/mL — ABNORMAL HIGH (ref 232–1245)

## 2023-09-03 LAB — CMP14+EGFR
ALT: 22 IU/L (ref 0–32)
AST: 23 IU/L (ref 0–40)
Albumin: 4.8 g/dL (ref 3.9–4.9)
Alkaline Phosphatase: 85 IU/L (ref 44–121)
BUN/Creatinine Ratio: 34 — ABNORMAL HIGH (ref 12–28)
BUN: 20 mg/dL (ref 8–27)
Bilirubin Total: 0.4 mg/dL (ref 0.0–1.2)
CO2: 20 mmol/L (ref 20–29)
Calcium: 10.1 mg/dL (ref 8.7–10.3)
Chloride: 102 mmol/L (ref 96–106)
Creatinine, Ser: 0.59 mg/dL (ref 0.57–1.00)
Globulin, Total: 2.9 g/dL (ref 1.5–4.5)
Glucose: 97 mg/dL (ref 70–99)
Potassium: 4.1 mmol/L (ref 3.5–5.2)
Sodium: 139 mmol/L (ref 134–144)
Total Protein: 7.7 g/dL (ref 6.0–8.5)
eGFR: 97 mL/min/{1.73_m2} (ref >59)

## 2023-09-03 LAB — VITAMIN D 25 HYDROXY (VIT D DEFICIENCY, FRACTURES): Vit D, 25-Hydroxy: 53.5 ng/mL (ref 30.0–100.0)

## 2023-09-03 LAB — TSH+FREE T4
Free T4: 0.92 ng/dL (ref 0.82–1.77)
TSH: 3.26 u[IU]/mL (ref 0.450–4.500)

## 2023-09-03 LAB — HGB A1C W/O EAG: Hgb A1c MFr Bld: 5.6 % (ref 4.8–5.6)

## 2023-09-03 LAB — IRON,TIBC AND FERRITIN PANEL
Ferritin: 27 ng/mL (ref 15–150)
Iron Saturation: 26 % (ref 15–55)
Iron: 119 ug/dL (ref 27–139)
Total Iron Binding Capacity: 456 ug/dL — ABNORMAL HIGH (ref 250–450)
UIBC: 337 ug/dL (ref 118–369)

## 2023-10-13 ENCOUNTER — Encounter: Payer: Self-pay | Admitting: Nurse Practitioner

## 2023-10-13 ENCOUNTER — Ambulatory Visit: Admitting: Nurse Practitioner

## 2023-10-13 ENCOUNTER — Telehealth: Payer: Self-pay | Admitting: Nurse Practitioner

## 2023-10-13 VITALS — BP 130/86 | HR 88 | Temp 97.3°F | Resp 16 | Ht 65.0 in | Wt 156.6 lb

## 2023-10-13 DIAGNOSIS — G4709 Other insomnia: Secondary | ICD-10-CM

## 2023-10-13 DIAGNOSIS — K219 Gastro-esophageal reflux disease without esophagitis: Secondary | ICD-10-CM | POA: Diagnosis not present

## 2023-10-13 DIAGNOSIS — E782 Mixed hyperlipidemia: Secondary | ICD-10-CM

## 2023-10-13 DIAGNOSIS — F321 Major depressive disorder, single episode, moderate: Secondary | ICD-10-CM | POA: Diagnosis not present

## 2023-10-13 DIAGNOSIS — F411 Generalized anxiety disorder: Secondary | ICD-10-CM

## 2023-10-13 MED ORDER — CLONAZEPAM 0.5 MG PO TABS: ORAL_TABLET | ORAL | 2 refills | Status: DC

## 2023-10-13 MED ORDER — CITALOPRAM HYDROBROMIDE 40 MG PO TABS: 40.0000 mg | ORAL_TABLET | Freq: Every day | ORAL | 3 refills | Status: AC

## 2023-10-13 NOTE — Telephone Encounter (Signed)
 Lvm to schedule 3 mo follow up-Toni

## 2023-10-13 NOTE — Progress Notes (Signed)
 [facility] [address] [place], [place] [address]  Internal MEDICINE  Office Visit Note  Patient Name: Jasmine Barron  [id number]  [id number]  Date of Service: 10/13/2023  Chief Complaint  Patient presents with   Hyperlipidemia   Hypertension   Follow-up    HPI Jasmine Barron presents for a follow-up visit for high cholesterol, GERD and anxiety.  Hyperlipidemia -- improved LDL, increased triglyceride level. HDL and VLDL are normal. Chol/HDL ratio is 3.3 which is 1/2 the average risk. Taking rosuvastatin   Anxiety -- taking clonazepam  as needed. No issues, due for refills.  GERD -- symptoms controlled by omeprazole .  Insomnia -- takes clonazepam  as needed     Current Medication: Outpatient Encounter Medications as of 10/13/2023  Medication Sig   calcium  citrate-vitamin D  (CITRACAL+D) 315-200 MG-UNIT tablet Take 1 tablet by mouth 2 (two) times daily.   citalopram  (CELEXA ) 40 MG tablet Take 1 tablet (40 mg total) by mouth daily.   clonazePAM  (KLONOPIN ) 0.5 MG tablet Take one tab po qhs for anxiety and insomnia   linaclotide  (LINZESS ) 290 MCG CAPS capsule Take 1 capsule (290 mcg total) by mouth daily.   Omega-3 Fatty Acids (FISH OIL) 1000 MG CAPS Take 1 capsule by mouth daily.   omeprazole  (PRILOSEC) 20 MG capsule Take 1 capsule (20 mg total) by mouth daily.   rosuvastatin  (CRESTOR ) 5 MG tablet Take 1 tablet (5 mg total) by mouth daily.   [DISCONTINUED] citalopram  (CELEXA ) 40 MG tablet Take 1 tablet (40 mg total) by mouth daily.   [DISCONTINUED] clonazePAM  (KLONOPIN ) 0.5 MG tablet Take one tab po qhs for anxiety and insomnia   No facility-administered encounter medications on file as of 10/13/2023.    Surgical History: Past Surgical History:  Procedure Laterality Date   COLONOSCOPY     COLONOSCOPY WITH PROPOFOL  N/A 02/04/2021   Procedure: COLONOSCOPY WITH PROPOFOL ;  Surgeon: Therisa Bi, MD;  Location: [facility] ENDOSCOPY;  Service: Gastroenterology;  Laterality: N/A;    ESOPHAGOGASTRODUODENOSCOPY N/A 02/04/2021   Procedure: ESOPHAGOGASTRODUODENOSCOPY (EGD);  Surgeon: Therisa Bi, MD;  Location: [facility] ENDOSCOPY;  Service: Gastroenterology;  Laterality: N/A;   TONSILLECTOMY Bilateral     Medical History: Past Medical History:  Diagnosis Date   Anxiety    High cholesterol    Hypertension     Family History: Family History  Problem Relation Age of Onset   Diabetes Mother    Heart disease Mother    Hypertension Mother    Stroke Father    Diabetes Brother    Diabetes Maternal Grandmother     Social History   Socioeconomic History   Marital status: Divorced    Spouse name: Not on file   Number of children: Not on file   Years of education: Not on file   Highest education level: Not on file  Occupational History   Not on file  Tobacco Use   Smoking status: Never   Smokeless tobacco: Never  Vaping Use   Vaping status: Never Used  Substance and Sexual Activity   Alcohol use: Yes    Comment: 2-3 times a week wine and rum   Drug use: No   Sexual activity: Not on file  Other Topics Concern   Not on file  Social History Narrative   Not on file   Social Drivers of Health   Financial Resource Strain: Not on file  Food Insecurity: Not on file  Transportation Needs: Not on file  Physical Activity: Not on file  Stress: Not on file  Social Connections: Not on file  Intimate Partner Violence: Not on file      Review of Systems  Constitutional:  Positive for fatigue.  Respiratory: Negative.  Negative for cough, chest tightness, shortness of breath and wheezing.   Cardiovascular: Negative.  Negative for chest pain and palpitations.  Gastrointestinal:  Positive for constipation.  Genitourinary: Negative.   Musculoskeletal: Negative.   Psychiatric/Behavioral:  Positive for sleep disturbance. Negative for self-injury. The patient is not nervous/anxious.     Vital Signs: BP 130/86   Pulse 88   Temp (!) 97.3 F (36.3 C)   Resp 16   Ht  5' 5 (1.651 m)   Wt 156 lb 9.6 oz (71 kg)   SpO2 98%   BMI 26.06 kg/m    Physical Exam Vitals reviewed.  Constitutional:      General: She is not in acute distress.    Appearance: Normal appearance. She is not ill-appearing.  HENT:     Head: Normocephalic and atraumatic.  Eyes:     Pupils: Pupils are equal, round, and reactive to light.  Cardiovascular:     Rate and Rhythm: Normal rate and regular rhythm.  Pulmonary:     Effort: Pulmonary effort is normal. No respiratory distress.  Neurological:     Mental Status: She is alert and oriented to person, place, and time.  Psychiatric:        Mood and Affect: Mood normal.        Behavior: Behavior normal.        Assessment/Plan: 1. Mixed hyperlipidemia (Primary) Continue rosuvastatin  as prescribed.   2. GERD without esophagitis Continue omeprazole  as prescribed.   3. Other insomnia Continue clonazepam  as needed as prescribed. Follow up in 3 months for additional refills.  - clonazePAM  (KLONOPIN ) 0.5 MG tablet; Take one tab po qhs for anxiety and insomnia  Dispense: 30 tablet; Refill: 2  4. GAD (generalized anxiety disorder) Continue citalopram  as prescribed. Continue clonazepam  as needed as prescribed. Follow up in 3 months for additional refills.  - citalopram  (CELEXA ) 40 MG tablet; Take 1 tablet (40 mg total) by mouth daily.  Dispense: 90 tablet; Refill: 3  5. Moderate major depression (HCC) Continue citalopram  as prescribed  - citalopram  (CELEXA ) 40 MG tablet; Take 1 tablet (40 mg total) by mouth daily.  Dispense: 90 tablet; Refill: 3   General Counseling: Jasmine Barron verbalizes understanding of the findings of todays visit and agrees with plan of treatment. I have discussed any further diagnostic evaluation that may be needed or ordered today. We also reviewed her medications today. she has been encouraged to call the office with any questions or concerns that should arise related to todays visit.    No orders of the  defined types were placed in this encounter.   Meds ordered this encounter  Medications   clonazePAM  (KLONOPIN ) 0.5 MG tablet    Sig: Take one tab po qhs for anxiety and insomnia    Dispense:  30 tablet    Refill:  2    For future refills   citalopram  (CELEXA ) 40 MG tablet    Sig: Take 1 tablet (40 mg total) by mouth daily.    Dispense:  90 tablet    Refill:  3    Please discontinue previous orders for citalopram , patient requesting 90 day supply. For future refills    Return in about 3 months (around 01/04/2024) for F/U, anxiety med refill, Porshea Janowski PCP.   Total time spent:30 Minutes Time spent includes review of  chart, medications, test results, and follow up plan with the patient.   [place] Controlled Substance Database was reviewed by me.  This patient was seen by Mardy Maxin, FNP-C in collaboration with Dr. Sigrid Bathe as a part of collaborative care agreement.   Osha Errico R. Maxin, MSN, FNP-C Internal medicine

## 2023-10-14 ENCOUNTER — Telehealth: Payer: Self-pay | Admitting: Nurse Practitioner

## 2023-10-14 NOTE — Telephone Encounter (Signed)
 Left 2nd vm to schedule follow up-Toni

## 2023-10-28 DIAGNOSIS — H35371 Puckering of macula, right eye: Secondary | ICD-10-CM | POA: Diagnosis not present

## 2023-10-28 DIAGNOSIS — H2513 Age-related nuclear cataract, bilateral: Secondary | ICD-10-CM | POA: Diagnosis not present

## 2023-10-28 DIAGNOSIS — H43813 Vitreous degeneration, bilateral: Secondary | ICD-10-CM | POA: Diagnosis not present

## 2023-11-01 ENCOUNTER — Telehealth: Payer: Self-pay | Admitting: Nurse Practitioner

## 2023-11-01 NOTE — Telephone Encounter (Signed)
 Left 3rd  vm and mailed letter to schedule follow up

## 2023-12-28 ENCOUNTER — Ambulatory Visit: Admitting: Nurse Practitioner

## 2023-12-28 ENCOUNTER — Encounter: Payer: Self-pay | Admitting: Nurse Practitioner

## 2023-12-28 VITALS — BP 134/82 | HR 72 | Temp 97.1°F | Resp 16 | Ht 65.0 in | Wt 155.4 lb

## 2023-12-28 DIAGNOSIS — G4709 Other insomnia: Secondary | ICD-10-CM | POA: Diagnosis not present

## 2023-12-28 DIAGNOSIS — Z1211 Encounter for screening for malignant neoplasm of colon: Secondary | ICD-10-CM

## 2023-12-28 DIAGNOSIS — Z1212 Encounter for screening for malignant neoplasm of rectum: Secondary | ICD-10-CM

## 2023-12-28 DIAGNOSIS — Z23 Encounter for immunization: Secondary | ICD-10-CM | POA: Diagnosis not present

## 2023-12-28 DIAGNOSIS — K219 Gastro-esophageal reflux disease without esophagitis: Secondary | ICD-10-CM | POA: Diagnosis not present

## 2023-12-28 MED ORDER — COVID-19 MRNA VAC-TRIS(PFIZER) 30 MCG/0.3ML IM SUSY: 0.3000 mL | PREFILLED_SYRINGE | Freq: Once | INTRAMUSCULAR | 0 refills | Status: DC | PRN

## 2023-12-28 MED ORDER — PNEUMOCOCCAL 20-VAL CONJ VACC 0.5 ML IM SUSY: 0.5000 mL | PREFILLED_SYRINGE | Freq: Once | INTRAMUSCULAR | 0 refills | Status: DC | PRN

## 2023-12-28 MED ORDER — CLONAZEPAM 0.5 MG PO TABS: ORAL_TABLET | ORAL | 2 refills | Status: DC

## 2023-12-28 NOTE — Progress Notes (Signed)
 [facility] [address] [place], [place] [address]  Internal MEDICINE  Office Visit Note  Patient Name: Jasmine Barron  [id number]  [id number]  Date of Service: 12/28/2023  Chief Complaint  Patient presents with   Hyperlipidemia   Hypertension   Follow-up   Quality Metric Gaps    colonoscopy    HPI Tasha presents for a follow-up visit for GERD, insomnia, CRC screening and vaccinations.  GERD -- symptoms are not adequately controlled with current medication CRC screening -- due for routine colonoscopy  Insomnia -- takes clonazepam  for sleep which is effective. Due for refills.  Requesting flu vaccine Due for pneumonia vaccine and covid booster.     Current Medication: Outpatient Encounter Medications as of 12/28/2023  Medication Sig   COVID-19 mRNA vaccine, Pfizer, (COMIRNATY) syringe Inject 0.3 mLs into the muscle once as needed for up to 1 dose (covid vaccination).   pneumococcal 20-valent conjugate vaccine (PREVNAR 20) 0.5 ML injection Inject 0.5 mLs into the muscle once as needed for up to 1 dose for immunization.   calcium  citrate-vitamin D  (CITRACAL+D) 315-200 MG-UNIT tablet Take 1 tablet by mouth 2 (two) times daily.   citalopram  (CELEXA ) 40 MG tablet Take 1 tablet (40 mg total) by mouth daily.   clonazePAM  (KLONOPIN ) 0.5 MG tablet Take one tab po qhs for anxiety and insomnia   linaclotide  (LINZESS ) 290 MCG CAPS capsule Take 1 capsule (290 mcg total) by mouth daily.   Omega-3 Fatty Acids (FISH OIL) 1000 MG CAPS Take 1 capsule by mouth daily.   omeprazole  (PRILOSEC) 20 MG capsule Take 1 capsule (20 mg total) by mouth daily.   rosuvastatin  (CRESTOR ) 5 MG tablet Take 1 tablet (5 mg total) by mouth daily.   [DISCONTINUED] clonazePAM  (KLONOPIN ) 0.5 MG tablet Take one tab po qhs for anxiety and insomnia   No facility-administered encounter medications on file as of 12/28/2023.    Surgical History: Past Surgical History:  Procedure Laterality Date   COLONOSCOPY      COLONOSCOPY WITH PROPOFOL  N/A 02/04/2021   Procedure: COLONOSCOPY WITH PROPOFOL ;  Surgeon: Therisa Bi, MD;  Location: [facility] ENDOSCOPY;  Service: Gastroenterology;  Laterality: N/A;   ESOPHAGOGASTRODUODENOSCOPY N/A 02/04/2021   Procedure: ESOPHAGOGASTRODUODENOSCOPY (EGD);  Surgeon: Therisa Bi, MD;  Location: [facility] ENDOSCOPY;  Service: Gastroenterology;  Laterality: N/A;   TONSILLECTOMY Bilateral     Medical History: Past Medical History:  Diagnosis Date   Anxiety    High cholesterol    Hypertension     Family History: Family History  Problem Relation Age of Onset   Diabetes Mother    Heart disease Mother    Hypertension Mother    Stroke Father    Diabetes Brother    Diabetes Maternal Grandmother     Social History   Socioeconomic History   Marital status: Divorced    Spouse name: Not on file   Number of children: Not on file   Years of education: Not on file   Highest education level: Not on file  Occupational History   Not on file  Tobacco Use   Smoking status: Never   Smokeless tobacco: Never  Vaping Use   Vaping status: Never Used  Substance and Sexual Activity   Alcohol use: Yes    Comment: 2-3 times a week wine and rum   Drug use: No   Sexual activity: Not on file  Other Topics Concern   Not on file  Social History Narrative   Not on file   Social  Drivers of Corporate Investment Banker Strain: Not on file  Food Insecurity: Not on file  Transportation Needs: Not on file  Physical Activity: Not on file  Stress: Not on file  Social Connections: Not on file  Intimate Partner Violence: Not on file      Review of Systems  Constitutional:  Positive for fatigue.  Respiratory: Negative.  Negative for cough, chest tightness, shortness of breath and wheezing.   Cardiovascular: Negative.  Negative for chest pain and palpitations.  Gastrointestinal:  Positive for constipation.  Genitourinary: Negative.   Musculoskeletal: Negative.    Psychiatric/Behavioral:  Positive for sleep disturbance. Negative for self-injury. The patient is not nervous/anxious.     Vital Signs: BP 134/82   Pulse 72   Temp (!) 97.1 F (36.2 C)   Resp 16   Ht 5' 5 (1.651 m)   Wt 155 lb 6.4 oz (70.5 kg)   SpO2 98%   BMI 25.86 kg/m    Physical Exam Vitals reviewed.  Constitutional:      General: She is not in acute distress.    Appearance: Normal appearance. She is not ill-appearing.  HENT:     Head: Normocephalic and atraumatic.  Eyes:     Pupils: Pupils are equal, round, and reactive to light.  Cardiovascular:     Rate and Rhythm: Normal rate and regular rhythm.  Pulmonary:     Effort: Pulmonary effort is normal. No respiratory distress.  Neurological:     Mental Status: She is alert and oriented to person, place, and time.  Psychiatric:        Mood and Affect: Mood normal.        Behavior: Behavior normal.        Assessment/Plan: 1. Gastroesophageal reflux disease without esophagitis Referred to Dr. Therisa with GI. - Ambulatory referral to Gastroenterology  2. Other insomnia (Primary) Continue clonazepam  as prescribed. Follow up in 3 months for additional refills.  - clonazePAM  (KLONOPIN ) 0.5 MG tablet; Take one tab po qhs for anxiety and insomnia  Dispense: 30 tablet; Refill: 2  3. Screening for colorectal cancer Referred to GI with Dr. Therisa for routine colonoscopy.  - Ambulatory referral to Gastroenterology  4. Need for vaccination Flu vaccine administered today in office. Pneumonia and covid vaccine prescriptions sent to pharmacy.  - Influenza, MDCK, trivalent, PF(Flucelvax egg-free) - pneumococcal 20-valent conjugate vaccine (PREVNAR 20) 0.5 ML injection; Inject 0.5 mLs into the muscle once as needed for up to 1 dose for immunization.  Dispense: 0.5 mL; Refill: 0 - COVID-19 mRNA vaccine, Pfizer, (COMIRNATY) syringe; Inject 0.3 mLs into the muscle once as needed for up to 1 dose (covid vaccination).  Dispense: 0.3  mL; Refill: 0   General Counseling: Rosealee verbalizes understanding of the findings of todays visit and agrees with plan of treatment. I have discussed any further diagnostic evaluation that may be needed or ordered today. We also reviewed her medications today. she has been encouraged to call the office with any questions or concerns that should arise related to todays visit.    Orders Placed This Encounter  Procedures   Influenza, MDCK, trivalent, PF(Flucelvax egg-free)   Ambulatory referral to Gastroenterology    Meds ordered this encounter  Medications   clonazePAM  (KLONOPIN ) 0.5 MG tablet    Sig: Take one tab po qhs for anxiety and insomnia    Dispense:  30 tablet    Refill:  2    For future refills   pneumococcal 20-valent conjugate vaccine (PREVNAR  20) 0.5 ML injection    Sig: Inject 0.5 mLs into the muscle once as needed for up to 1 dose for immunization.    Dispense:  0.5 mL    Refill:  0    Due for prevnar 20.   COVID-19 mRNA vaccine, Pfizer, (COMIRNATY) syringe    Sig: Inject 0.3 mLs into the muscle once as needed for up to 1 dose (covid vaccination).    Dispense:  0.3 mL    Refill:  0    Approved at provider discretion. Product selection permitted.    Return in about 3 months (around 03/20/2024) for F/U, anxiety med refill, Petr Bontempo PCP.   Total time spent:30 Minutes Time spent includes review of chart, medications, test results, and follow up plan with the patient.   [place] Controlled Substance Database was reviewed by me.  This patient was seen by Mardy Maxin, FNP-C in collaboration with Dr. Sigrid Bathe as a part of collaborative care agreement.   Sina Lucchesi R. Maxin, MSN, FNP-C Internal medicine

## 2023-12-29 ENCOUNTER — Telehealth: Payer: Self-pay | Admitting: Nurse Practitioner

## 2023-12-29 NOTE — Telephone Encounter (Signed)
 Awaiting 12/28/23 office notes for Gastroenterology referral-Toni

## 2024-01-28 ENCOUNTER — Encounter: Payer: Self-pay | Admitting: Nurse Practitioner

## 2024-01-28 DIAGNOSIS — G4709 Other insomnia: Secondary | ICD-10-CM | POA: Insufficient documentation

## 2024-01-28 DIAGNOSIS — K219 Gastro-esophageal reflux disease without esophagitis: Secondary | ICD-10-CM | POA: Insufficient documentation

## 2024-02-01 ENCOUNTER — Telehealth: Payer: Self-pay | Admitting: Nurse Practitioner

## 2024-02-01 NOTE — Telephone Encounter (Signed)
 GI referral sent via [facility] to [facility]. Lvm notifying patient. Gave patient telephone # [phone]

## 2024-02-01 NOTE — Telephone Encounter (Signed)
 Gastroenterology appointment 06/28/2024 with [facility]-Toni

## 2024-03-20 ENCOUNTER — Ambulatory Visit: Admitting: Nurse Practitioner

## 2024-03-26 ENCOUNTER — Ambulatory Visit: Admitting: Nurse Practitioner

## 2024-03-26 ENCOUNTER — Encounter: Payer: Self-pay | Admitting: Nurse Practitioner

## 2024-03-26 DIAGNOSIS — K5909 Other constipation: Secondary | ICD-10-CM

## 2024-03-26 DIAGNOSIS — K219 Gastro-esophageal reflux disease without esophagitis: Secondary | ICD-10-CM | POA: Diagnosis not present

## 2024-03-26 DIAGNOSIS — G4709 Other insomnia: Secondary | ICD-10-CM

## 2024-03-26 DIAGNOSIS — E782 Mixed hyperlipidemia: Secondary | ICD-10-CM

## 2024-03-26 MED ORDER — ROSUVASTATIN CALCIUM 5 MG PO TABS: 5.0000 mg | ORAL_TABLET | Freq: Every day | ORAL | 3 refills | Status: AC

## 2024-03-26 MED ORDER — OMEPRAZOLE 20 MG PO CPDR: 20.0000 mg | DELAYED_RELEASE_CAPSULE | Freq: Every day | ORAL | 3 refills | Status: AC

## 2024-03-26 MED ORDER — LINACLOTIDE 290 MCG PO CAPS: 290.0000 ug | ORAL_CAPSULE | Freq: Every day | ORAL | 3 refills | Status: AC

## 2024-03-26 MED ORDER — CLONAZEPAM 0.5 MG PO TABS: ORAL_TABLET | ORAL | 2 refills | Status: AC

## 2024-03-26 NOTE — Progress Notes (Signed)
 [facility] [address] [place], [place] [address]  Internal MEDICINE  Office Visit Note  Patient Name: Jasmine Barron  [id number]  [id number]  Date of Service: 03/26/2024  Chief Complaint  Patient presents with   Hyperlipidemia   Hypertension   Follow-up    HPI Kessa presents for a follow-up visit for anxiety, constipation, GERD and refills.  Anxiety -- taking clonazepam  as needed, due for refills.  Constipation -- taking linzess  290 mcg daily.  GERD -- taking omeprazole  20 mg daily High cholesterol -- taking rosuvastatin  daily Due for routine colonoscopy for CRC screening -- has appt with GI in April.  Due for AWV in January, labs are up to date from June this year.     Current Medication: Outpatient Encounter Medications as of 03/26/2024  Medication Sig   calcium  citrate-vitamin D  (CITRACAL+D) 315-200 MG-UNIT tablet Take 1 tablet by mouth 2 (two) times daily.   citalopram  (CELEXA ) 40 MG tablet Take 1 tablet (40 mg total) by mouth daily.   clonazePAM  (KLONOPIN ) 0.5 MG tablet Take one tab po qhs for anxiety and insomnia   linaclotide  (LINZESS ) 290 MCG CAPS capsule Take 1 capsule (290 mcg total) by mouth daily.   Omega-3 Fatty Acids (FISH OIL) 1000 MG CAPS Take 1 capsule by mouth daily.   omeprazole  (PRILOSEC) 20 MG capsule Take 1 capsule (20 mg total) by mouth daily.   rosuvastatin  (CRESTOR ) 5 MG tablet Take 1 tablet (5 mg total) by mouth daily.   [DISCONTINUED] clonazePAM  (KLONOPIN ) 0.5 MG tablet Take one tab po qhs for anxiety and insomnia   [DISCONTINUED] COVID-19 mRNA vaccine, Pfizer, (COMIRNATY) syringe Inject 0.3 mLs into the muscle once as needed for up to 1 dose (covid vaccination).   [DISCONTINUED] linaclotide  (LINZESS ) 290 MCG CAPS capsule Take 1 capsule (290 mcg total) by mouth daily.   [DISCONTINUED] omeprazole  (PRILOSEC) 20 MG capsule Take 1 capsule (20 mg total) by mouth daily.   [DISCONTINUED] pneumococcal 20-valent conjugate vaccine (PREVNAR 20)  0.5 ML injection Inject 0.5 mLs into the muscle once as needed for up to 1 dose for immunization.   [DISCONTINUED] rosuvastatin  (CRESTOR ) 5 MG tablet Take 1 tablet (5 mg total) by mouth daily.   No facility-administered encounter medications on file as of 03/26/2024.    Surgical History: Past Surgical History:  Procedure Laterality Date   COLONOSCOPY     COLONOSCOPY WITH PROPOFOL  N/A 02/04/2021   Procedure: COLONOSCOPY WITH PROPOFOL ;  Surgeon: Therisa Bi, MD;  Location: [facility] ENDOSCOPY;  Service: Gastroenterology;  Laterality: N/A;   ESOPHAGOGASTRODUODENOSCOPY N/A 02/04/2021   Procedure: ESOPHAGOGASTRODUODENOSCOPY (EGD);  Surgeon: Therisa Bi, MD;  Location: [facility] ENDOSCOPY;  Service: Gastroenterology;  Laterality: N/A;   TONSILLECTOMY Bilateral     Medical History: Past Medical History:  Diagnosis Date   Anxiety    High cholesterol    Hypertension     Family History: Family History  Problem Relation Age of Onset   Diabetes Mother    Heart disease Mother    Hypertension Mother    Stroke Father    Diabetes Brother    Diabetes Maternal Grandmother     Social History   Socioeconomic History   Marital status: Divorced    Spouse name: Not on file   Number of children: Not on file   Years of education: Not on file   Highest education level: Not on file  Occupational History   Not on file  Tobacco Use   Smoking status: Never   Smokeless tobacco: Never  Vaping Use   Vaping status: Never Used  Substance and Sexual Activity   Alcohol use: Yes    Comment: 2-3 times a week wine and rum   Drug use: No   Sexual activity: Not on file  Other Topics Concern   Not on file  Social History Narrative   Not on file   Social Drivers of Health   Tobacco Use: Low Risk (03/26/2024)   Patient History    Smoking Tobacco Use: Never    Smokeless Tobacco Use: Never    Passive Exposure: Not on file  Financial Resource Strain: Not on file  Food Insecurity: Not on file   Transportation Needs: Not on file  Physical Activity: Not on file  Stress: Not on file  Social Connections: Not on file  Intimate Partner Violence: Not on file  Depression (PHQ2-9): Low Risk (04/29/2023)   Depression (PHQ2-9)    PHQ-2 Score: 0  Alcohol Screen: Low Risk (10/21/2021)   Alcohol Screen    Last Alcohol Screening Score (AUDIT): 3  Housing: Not on file  Utilities: Not on file  Health Literacy: Not on file      Review of Systems  Constitutional:  Positive for fatigue.  Respiratory: Negative.  Negative for cough, chest tightness, shortness of breath and wheezing.   Cardiovascular: Negative.  Negative for chest pain and palpitations.  Gastrointestinal:  Positive for constipation.  Genitourinary: Negative.   Musculoskeletal: Negative.   Psychiatric/Behavioral:  Positive for sleep disturbance. Negative for self-injury. The patient is not nervous/anxious.     Vital Signs: BP 132/88   Pulse 80   Temp (!) 96 F (35.6 C)   Resp 16   Ht 5' 5 (1.651 m)   Wt 158 lb (71.7 kg)   SpO2 97%   BMI 26.29 kg/m    Physical Exam Vitals reviewed.  Constitutional:      General: She is not in acute distress.    Appearance: Normal appearance. She is not ill-appearing.  HENT:     Head: Normocephalic and atraumatic.  Eyes:     Pupils: Pupils are equal, round, and reactive to light.  Cardiovascular:     Rate and Rhythm: Normal rate and regular rhythm.  Pulmonary:     Effort: Pulmonary effort is normal. No respiratory distress.  Neurological:     Mental Status: She is alert and oriented to person, place, and time.  Psychiatric:        Mood and Affect: Mood normal.        Behavior: Behavior normal.        Assessment/Plan: 1. Chronic constipation Continue linzess  as prescribed  - linaclotide  (LINZESS ) 290 MCG CAPS capsule; Take 1 capsule (290 mcg total) by mouth daily.  Dispense: 90 capsule; Refill: 3  2. GERD without esophagitis Continue omeprazole  as prescribed.   - omeprazole  (PRILOSEC) 20 MG capsule; Take 1 capsule (20 mg total) by mouth daily.  Dispense: 90 capsule; Refill: 3  3. Mixed hyperlipidemia Continue rosuvastatin  as prescribed.  - rosuvastatin  (CRESTOR ) 5 MG tablet; Take 1 tablet (5 mg total) by mouth daily.  Dispense: 90 tablet; Refill: 3  4. Other insomnia Continue clonazepam  as prescribed  - clonazePAM  (KLONOPIN ) 0.5 MG tablet; Take one tab po qhs for anxiety and insomnia  Dispense: 30 tablet; Refill: 2   General Counseling: Pualani verbalizes understanding of the findings of todays visit and agrees with plan of treatment. I have discussed any further diagnostic evaluation that may be needed or ordered today. We also reviewed  her medications today. she has been encouraged to call the office with any questions or concerns that should arise related to todays visit.    No orders of the defined types were placed in this encounter.   Meds ordered this encounter  Medications   linaclotide  (LINZESS ) 290 MCG CAPS capsule    Sig: Take 1 capsule (290 mcg total) by mouth daily.    Dispense:  90 capsule    Refill:  3    For future refill   omeprazole  (PRILOSEC) 20 MG capsule    Sig: Take 1 capsule (20 mg total) by mouth daily.    Dispense:  90 capsule    Refill:  3   rosuvastatin  (CRESTOR ) 5 MG tablet    Sig: Take 1 tablet (5 mg total) by mouth daily.    Dispense:  90 tablet    Refill:  3   clonazePAM  (KLONOPIN ) 0.5 MG tablet    Sig: Take one tab po qhs for anxiety and insomnia    Dispense:  30 tablet    Refill:  2    For future refills    Return for previously scheduled, AWV, Haroon Shatto PCP in january next month .   Total time spent:30 Minutes Time spent includes review of chart, medications, test results, and follow up plan with the patient.   [place] Controlled Substance Database was reviewed by me.  This patient was seen by Mardy Maxin, FNP-C in collaboration with Dr. Sigrid Bathe as a part of collaborative care  agreement.   Nefertari Rebman R. Maxin, MSN, FNP-C Internal medicine

## 2024-03-31 ENCOUNTER — Encounter: Payer: Self-pay | Admitting: Nurse Practitioner

## 2024-05-01 ENCOUNTER — Encounter: Payer: Self-pay | Admitting: Nurse Practitioner

## 2024-05-01 ENCOUNTER — Ambulatory Visit (INDEPENDENT_AMBULATORY_CARE_PROVIDER_SITE_OTHER): Payer: Medicare PPO | Admitting: Nurse Practitioner

## 2024-05-01 VITALS — BP 136/84 | HR 86 | Temp 95.6°F | Resp 16 | Ht 65.0 in | Wt 160.8 lb

## 2024-05-01 DIAGNOSIS — Z0001 Encounter for general adult medical examination with abnormal findings: Secondary | ICD-10-CM

## 2024-05-01 DIAGNOSIS — R7301 Impaired fasting glucose: Secondary | ICD-10-CM

## 2024-05-01 DIAGNOSIS — Z1231 Encounter for screening mammogram for malignant neoplasm of breast: Secondary | ICD-10-CM

## 2024-05-01 DIAGNOSIS — Z833 Family history of diabetes mellitus: Secondary | ICD-10-CM | POA: Diagnosis not present

## 2024-05-01 DIAGNOSIS — K5909 Other constipation: Secondary | ICD-10-CM | POA: Diagnosis not present

## 2024-05-01 DIAGNOSIS — E782 Mixed hyperlipidemia: Secondary | ICD-10-CM

## 2024-05-01 DIAGNOSIS — G4709 Other insomnia: Secondary | ICD-10-CM

## 2024-06-12 ENCOUNTER — Ambulatory Visit: Admitting: Nurse Practitioner

## 2025-05-02 ENCOUNTER — Ambulatory Visit: Admitting: Nurse Practitioner
